# Patient Record
Sex: Female | Born: 1960 | Race: White | Hispanic: No | Marital: Married | Smoking: Never smoker
Health system: Southern US, Community
[De-identification: ages and names within clinical notes are randomized; demographics above are authoritative.]

## PROBLEM LIST (undated history)

## (undated) DIAGNOSIS — N200 Calculus of kidney: Secondary | ICD-10-CM

## (undated) DIAGNOSIS — I341 Nonrheumatic mitral (valve) prolapse: Secondary | ICD-10-CM

## (undated) DIAGNOSIS — R350 Frequency of micturition: Secondary | ICD-10-CM

## (undated) DIAGNOSIS — R011 Cardiac murmur, unspecified: Secondary | ICD-10-CM

## (undated) DIAGNOSIS — Z8619 Personal history of other infectious and parasitic diseases: Secondary | ICD-10-CM

## (undated) DIAGNOSIS — R3989 Other symptoms and signs involving the genitourinary system: Secondary | ICD-10-CM

## (undated) DIAGNOSIS — R599 Enlarged lymph nodes, unspecified: Secondary | ICD-10-CM

## (undated) DIAGNOSIS — R3915 Urgency of urination: Secondary | ICD-10-CM

## (undated) DIAGNOSIS — Z8679 Personal history of other diseases of the circulatory system: Secondary | ICD-10-CM

## (undated) HISTORY — DX: Calculus of kidney: N20.0

## (undated) HISTORY — PX: STAPEDES SURGERY: SHX789

---

## 2003-01-02 HISTORY — PX: OTHER SURGICAL HISTORY: SHX169

## 2006-03-29 ENCOUNTER — Ambulatory Visit: Payer: Self-pay

## 2006-09-08 ENCOUNTER — Ambulatory Visit: Payer: Self-pay | Admitting: Orthopaedic Surgery

## 2007-04-10 ENCOUNTER — Ambulatory Visit: Payer: Self-pay

## 2007-09-04 ENCOUNTER — Ambulatory Visit: Payer: Self-pay

## 2008-01-24 ENCOUNTER — Ambulatory Visit: Payer: Self-pay | Admitting: Unknown Physician Specialty

## 2008-01-30 ENCOUNTER — Ambulatory Visit: Payer: Self-pay | Admitting: Unknown Physician Specialty

## 2008-04-16 ENCOUNTER — Ambulatory Visit: Payer: Self-pay

## 2009-05-20 ENCOUNTER — Ambulatory Visit: Payer: Self-pay

## 2010-05-26 ENCOUNTER — Ambulatory Visit: Payer: Self-pay

## 2011-06-01 ENCOUNTER — Ambulatory Visit: Payer: Self-pay

## 2012-02-02 HISTORY — PX: BREAST BIOPSY: SHX20

## 2012-06-01 ENCOUNTER — Ambulatory Visit: Payer: Self-pay | Admitting: Family Medicine

## 2012-06-01 HISTORY — PX: COLONOSCOPY: SHX174

## 2012-06-12 ENCOUNTER — Ambulatory Visit: Payer: Self-pay | Admitting: Family Medicine

## 2012-06-22 ENCOUNTER — Ambulatory Visit: Payer: Self-pay | Admitting: Gastroenterology

## 2012-06-27 ENCOUNTER — Ambulatory Visit: Payer: Self-pay | Admitting: General Surgery

## 2012-07-19 ENCOUNTER — Ambulatory Visit (INDEPENDENT_AMBULATORY_CARE_PROVIDER_SITE_OTHER): Payer: 59 | Admitting: General Surgery

## 2012-07-19 ENCOUNTER — Encounter: Payer: Self-pay | Admitting: General Surgery

## 2012-07-19 VITALS — BP 120/80 | HR 72 | Resp 12 | Ht 64.0 in | Wt 116.0 lb

## 2012-07-19 DIAGNOSIS — R920 Mammographic microcalcification found on diagnostic imaging of breast: Secondary | ICD-10-CM | POA: Insufficient documentation

## 2012-07-19 NOTE — Patient Instructions (Addendum)
Continue self breast exams. Call office for any new breast issues or concerns. Patient wants to talk to her husband before deciding about biopsy. The stereotactic procedure was reviewed with the patient. The potential for bleeding, infection and pain was reviewed. Schedule for August 07 2012  Patient has been scheduled for a left breast stereotactic biopsy at [facility] for 08-07-12 at 3 pm. She will check-in at the [facility] at 2:30 pm. This patient is aware of date, time, and instructions. Patient verbalizes understanding.

## 2012-07-19 NOTE — Progress Notes (Signed)
Patient ID: Angela Olson, female   DOB: 1960/03/05, 52 y.o.   MRN: [id number]  Chief Complaint  Patient presents with  . Follow-up    mammogram    HPI Angela Olson is a 52 y.o. female.  Who presents for a breast evaluation referred by Dr Dear. The most recent mammogram was done on 06-12-12 CAT 4.  Patient does perform regular self breast checks and gets regular mammograms done. Family history of breast cancer includes maternal aunt. Denies breast trauma and states she "cant feel anything in the breast."  The patient was last evaluated in 2004 when she was identified with a small 9 mm simple cyst in the 3:00 position of the left breast.  HPI  Past Medical History  Diagnosis Date  . Breast cyst Dec 2004    left  . Lymph node symptom April 1998    left groin    Past Surgical History  Procedure Laterality Date  . Colonoscopy  May 2014    Dr Servando Snare  . Inner ear surgery Left 2012    Family History  Problem Relation Age of Onset  . Breast cancer Maternal Aunt     Social History History  Substance Use Topics  . Smoking status: Never Smoker   . Smokeless tobacco: Not on file  . Alcohol Use: No    Allergies  Allergen Reactions  . Tetracyclines & Related Rash    Current Outpatient Prescriptions  Medication Sig Dispense Refill  . Calcium Carbonate-Vitamin D (CALCIUM + D PO) Take 1 tablet by mouth daily.      . fish oil-omega-3 fatty acids 1000 MG capsule Take 2 g by mouth daily.       No current facility-administered medications for this visit.    Review of Systems Review of Systems  Constitutional: Negative.   Respiratory: Negative.   Cardiovascular: Negative.     Blood pressure 120/80, pulse 72, resp. rate 12, height 5\' 4"  (1.626 m), weight 116 lb (52.617 kg).  Physical Exam Physical Exam  Constitutional: She is oriented to person, place, and time. She appears well-developed and well-nourished.  Cardiovascular: Normal rate, regular rhythm and normal heart sounds.    Pulmonary/Chest: Effort normal and breath sounds normal. Right breast exhibits no inverted nipple, no mass, no nipple discharge, no skin change and no tenderness. Left breast exhibits no inverted nipple, no mass, no nipple discharge, no skin change and no tenderness.  Lymphadenopathy:    She has no cervical adenopathy.    She has no axillary adenopathy.  Neurological: She is alert and oriented to person, place, and time.  Skin: Skin is warm and dry.    Data Reviewed Focal spot compression views of the left breast in 06/12/2012 showed an indeterminate cluster microcalcifications in the upper outer quadrant for which biopsy was recommended. BI-RAD-4.  Assessment    Microcalcifications left breast.    Plan    Stereotactic breast biopsy was reviewed. The risks and benefits including those related to bleeding and infection were discussed. The patient was amenable to proceed.     Patient has been scheduled for a left breast stereotactic biopsy at [facility] for 08-07-12 at 3 pm. She will check-in at the [facility] at 2:30 pm. This patient is aware of date, time, and instructions. Patient verbalizes understanding.  Please note: Patient requested to wait till July to allow her to complete month end at work.    Angela Olson 07/20/2012, 3:53 PM

## 2012-07-20 ENCOUNTER — Encounter: Payer: Self-pay | Admitting: General Surgery

## 2012-08-03 ENCOUNTER — Telehealth: Payer: Self-pay | Admitting: *Deleted

## 2012-08-03 NOTE — Telephone Encounter (Signed)
Patient was left a message to call the office.   Per Erie Noe in mammography, patient is not longer on their stereo schedule for Monday, 08-07-12. She has not contacted our office in order to cancel but evidently contacted the scheduling department.

## 2012-08-03 NOTE — Telephone Encounter (Signed)
Patient did indeed want to cancel stereo biopsy for Monday. She wishes to wait 6 months for follow up. This patient has been placed in the recalls for November 2014 for a unilateral left breast diagnostic mammogram since last mammogram was completed in May of this year.   Kim in mammography has been notified of cancellation.

## 2012-08-16 ENCOUNTER — Ambulatory Visit: Payer: 59

## 2012-12-22 ENCOUNTER — Encounter: Payer: Self-pay | Admitting: General Surgery

## 2012-12-22 ENCOUNTER — Ambulatory Visit: Payer: Self-pay | Admitting: General Surgery

## 2013-01-03 ENCOUNTER — Ambulatory Visit (INDEPENDENT_AMBULATORY_CARE_PROVIDER_SITE_OTHER): Payer: 59 | Admitting: General Surgery

## 2013-01-03 ENCOUNTER — Encounter: Payer: Self-pay | Admitting: General Surgery

## 2013-01-03 VITALS — BP 128/78 | HR 64 | Resp 12 | Ht 64.0 in | Wt 118.0 lb

## 2013-01-03 DIAGNOSIS — R920 Mammographic microcalcification found on diagnostic imaging of breast: Secondary | ICD-10-CM

## 2013-01-03 NOTE — Progress Notes (Signed)
Patient ID: Angela Olson, female   DOB: 1960/02/17, 52 y.o.   MRN: [id number]  Chief Complaint  Patient presents with  . Follow-up    mammogram    HPI Angela Olson is a 51 y.o. female.  who presents for her 6 month follow up breast evaluation. The most recent mammogram was done on 12-22-12.  Patient does perform regular self breast checks and gets regular mammograms done.  No new breast complaints.  HPI  Past Medical History  Diagnosis Date  . Breast cyst Dec 2004    left  . Lymph node symptom April 1998    left groin    Past Surgical History  Procedure Laterality Date  . Colonoscopy  May 2014    Dr Servando Snare  . Inner ear surgery Left 2012    Family History  Problem Relation Age of Onset  . Breast cancer Maternal Aunt     Social History History  Substance Use Topics  . Smoking status: Never Smoker   . Smokeless tobacco: Never Used  . Alcohol Use: No    Allergies  Allergen Reactions  . Tetracyclines & Related Rash    Current Outpatient Prescriptions  Medication Sig Dispense Refill  . Calcium Carbonate-Vitamin D (CALCIUM + D PO) Take 1 tablet by mouth daily.      . fish oil-omega-3 fatty acids 1000 MG capsule Take 2 g by mouth daily.       No current facility-administered medications for this visit.    Review of Systems Review of Systems  Constitutional: Negative.   Respiratory: Negative.   Cardiovascular: Negative.     Blood pressure 128/78, pulse 64, resp. rate 12, height 5\' 4"  (1.626 m), weight 118 lb (53.524 kg).  Physical Exam Physical Exam  Constitutional: She is oriented to person, place, and time. She appears well-developed and well-nourished.  Neck: Neck supple.  Cardiovascular: Normal rate, regular rhythm and normal heart sounds.   Pulmonary/Chest: Effort normal and breath sounds normal. Right breast exhibits no inverted nipple, no mass, no nipple discharge, no skin change and no tenderness. Left breast exhibits no inverted nipple, no mass, no  nipple discharge, no skin change and no tenderness.  Lymphadenopathy:    She has no cervical adenopathy.    She has no axillary adenopathy.  Neurological: She is alert and oriented to person, place, and time.  Skin: Skin is warm and dry.    Data Reviewed Left breast mammogram dated December 22, 2012 suggested rounded calcifications with layering on the true lateral view consistent with milk of calcium. BI-RAD-3.  Review of her current films with the made 2014 exam shows increased number of calcifications in this area. While some additional layering, this was not a uniform finding. I really would've thought that this still would afford to the BI-RAD-4.    Assessment    Left breast microcalcifications.     Plan    Options for management were reviewed: 1) six-month followup was recommended by radiology versus 2) proceeding stereotactic biopsy to confirm a benign process. Pros and cons of each course were discussed. The patient is a little trouble but there is a discrepancy between the radiologist mild interpretation of the films. I've told her was perfectly reasonable to defer to the to the radiologist, but mild ligation was to tell her my opinion. She is at low risk, if indeed a pathologic process is present this would probably be low-grade DCIS the six-month interval will not put her at any risk.  The patient  ill consider her options and notify the office of how she would like to proceed. At a minimal range for bilateral diagnostic mammograms in 6 months to be sure she does not fall through the cracks.        [name] 01/05/2013, 8:47 PM

## 2013-01-03 NOTE — Patient Instructions (Addendum)
Continue self breast exams. Call office for any new breast issues or concerns. Patient to call back if she decides for a biopsy or for follow up mammogram Bilateral 6 months

## 2013-01-04 ENCOUNTER — Telehealth: Payer: Self-pay | Admitting: *Deleted

## 2013-01-04 NOTE — Telephone Encounter (Signed)
Pt was in the office here on 01/03/13 and was told to contact the office back when she wanted to have the BX done on her breast. She called today wanted to talk to you about the procedure she wanted to talk to you specifically. She stated that she had just a few questions to ask.

## 2013-01-07 NOTE — Telephone Encounter (Signed)
I contacted the patient regarding her desires: She would like to proceed to a left breast stereo biopsy. This will be scheduled for December 22.

## 2013-01-08 ENCOUNTER — Telehealth: Payer: Self-pay | Admitting: *Deleted

## 2013-01-08 DIAGNOSIS — R920 Mammographic microcalcification found on diagnostic imaging of breast: Secondary | ICD-10-CM

## 2013-01-08 NOTE — Telephone Encounter (Signed)
Patient has been scheduled for a left breast stereotactic biopsy at [facility] for 01-22-13 at 3 pm. She will check-in at the [facility] at 2:30 pm. This patient is aware of date, time, and instructions. Patient verbalizes understanding.

## 2013-01-08 NOTE — Telephone Encounter (Signed)
Message left on cell and work numbers for patient to call the office.  We need to arrange a left breast stereo biopsy at [facility] for 01-22-13.

## 2013-01-22 ENCOUNTER — Ambulatory Visit: Payer: Self-pay | Admitting: General Surgery

## 2013-01-22 DIAGNOSIS — R920 Mammographic microcalcification found on diagnostic imaging of breast: Secondary | ICD-10-CM

## 2013-01-23 ENCOUNTER — Telehealth: Payer: Self-pay | Admitting: General Surgery

## 2013-01-23 LAB — PATHOLOGY REPORT

## 2013-01-23 NOTE — Telephone Encounter (Signed)
Message left for her recently completed left breast biopsy was benign. She was asked to return to call to confirm receipt of the message. She will follow up with the nurse next week for mammogram and we'll arrange for bilateral diagnostic mammograms in 6 months.

## 2013-01-23 NOTE — Telephone Encounter (Signed)
Patient is in the recalls for 6 month follow up. Thanks!

## 2013-01-29 ENCOUNTER — Encounter: Payer: Self-pay | Admitting: General Surgery

## 2013-01-29 NOTE — Telephone Encounter (Signed)
Message left on patient's cell phone to confirm she received message from Dr. Lemar Livings left last week. She was also reminded of nurse follow up scheduled for 01-31-13.

## 2013-01-29 NOTE — Telephone Encounter (Signed)
Patient called the answering service today, 01-29-13 at 12:22 pm to report that she did receive message about results.

## 2013-01-30 ENCOUNTER — Ambulatory Visit: Payer: Self-pay | Admitting: General Practice

## 2013-01-31 ENCOUNTER — Ambulatory Visit (INDEPENDENT_AMBULATORY_CARE_PROVIDER_SITE_OTHER): Payer: 59 | Admitting: *Deleted

## 2013-01-31 DIAGNOSIS — R920 Mammographic microcalcification found on diagnostic imaging of breast: Secondary | ICD-10-CM

## 2013-01-31 NOTE — Progress Notes (Signed)
Patient here today for follow up post left breast stereo biopsy.  Steristrip in place and aware it may come off in one week.  Minimal bruising noted.  Aware of pathology. Follow up as scheduled.

## 2013-01-31 NOTE — Patient Instructions (Signed)
The patient is aware that a heating pad may be used for comfort as needed.   Continue self breast exams. Call office for any new breast issues or concerns. 

## 2013-06-19 ENCOUNTER — Emergency Department: Payer: Self-pay | Admitting: Emergency Medicine

## 2013-07-31 ENCOUNTER — Ambulatory Visit: Payer: 59 | Admitting: General Surgery

## 2013-08-23 ENCOUNTER — Encounter: Payer: Self-pay | Admitting: *Deleted

## 2013-09-28 ENCOUNTER — Encounter: Payer: Self-pay | Admitting: General Surgery

## 2015-02-02 DIAGNOSIS — N200 Calculus of kidney: Secondary | ICD-10-CM

## 2015-02-02 HISTORY — DX: Calculus of kidney: N20.0

## 2015-09-01 ENCOUNTER — Other Ambulatory Visit: Payer: Self-pay | Admitting: Urology

## 2015-09-01 ENCOUNTER — Encounter (HOSPITAL_BASED_OUTPATIENT_CLINIC_OR_DEPARTMENT_OTHER): Payer: Self-pay | Admitting: *Deleted

## 2015-09-01 NOTE — Progress Notes (Signed)
NPO AFTER MN.  ARRIVE AT [id number].  NEEDS HG.  MAY TAKE ZOFRAN/ VICODIN IF NEEDED AM DOS W/ SIPS OF WATER .

## 2015-09-03 ENCOUNTER — Ambulatory Visit (HOSPITAL_BASED_OUTPATIENT_CLINIC_OR_DEPARTMENT_OTHER)
Admission: RE | Admit: 2015-09-03 | Discharge: 2015-09-03 | Disposition: A | Discharge status: Home / Self Care | Payer: 59 | Source: Ambulatory Visit | Attending: Urology | Admitting: Urology

## 2015-09-03 ENCOUNTER — Ambulatory Visit (HOSPITAL_BASED_OUTPATIENT_CLINIC_OR_DEPARTMENT_OTHER): Payer: 59 | Admitting: Anesthesiology

## 2015-09-03 ENCOUNTER — Encounter (HOSPITAL_BASED_OUTPATIENT_CLINIC_OR_DEPARTMENT_OTHER): Payer: Self-pay | Admitting: *Deleted

## 2015-09-03 ENCOUNTER — Encounter (HOSPITAL_BASED_OUTPATIENT_CLINIC_OR_DEPARTMENT_OTHER)
Admission: RE | Disposition: A | Discharge status: Home / Self Care | Payer: Self-pay | Source: Ambulatory Visit | Attending: Urology

## 2015-09-03 DIAGNOSIS — N200 Calculus of kidney: Secondary | ICD-10-CM | POA: Diagnosis present

## 2015-09-03 DIAGNOSIS — N201 Calculus of ureter: Secondary | ICD-10-CM

## 2015-09-03 DIAGNOSIS — N132 Hydronephrosis with renal and ureteral calculous obstruction: Secondary | ICD-10-CM | POA: Insufficient documentation

## 2015-09-03 HISTORY — DX: Frequency of micturition: R35.0

## 2015-09-03 HISTORY — PX: STONE EXTRACTION WITH BASKET: SHX5318

## 2015-09-03 HISTORY — DX: Urgency of urination: R39.15

## 2015-09-03 HISTORY — PX: CYSTOSCOPY W/ RETROGRADES: SHX1426

## 2015-09-03 HISTORY — DX: Enlarged lymph nodes, unspecified: R59.9

## 2015-09-03 HISTORY — DX: Personal history of other diseases of the circulatory system: Z86.79

## 2015-09-03 HISTORY — DX: Other symptoms and signs involving the genitourinary system: R39.89

## 2015-09-03 HISTORY — DX: Calculus of kidney: N20.0

## 2015-09-03 HISTORY — DX: Nonrheumatic mitral (valve) prolapse: I34.1

## 2015-09-03 HISTORY — DX: Cardiac murmur, unspecified: R01.1

## 2015-09-03 HISTORY — DX: Personal history of other infectious and parasitic diseases: Z86.19

## 2015-09-03 HISTORY — PX: CYSTOSCOPY WITH RETROGRADE PYELOGRAM, URETEROSCOPY AND STENT PLACEMENT: SHX5789

## 2015-09-03 HISTORY — PX: CYSTOSCOPY WITH HOLMIUM LASER LITHOTRIPSY: SHX6639

## 2015-09-03 LAB — POCT HEMOGLOBIN-HEMACUE: HEMOGLOBIN: 13.1 g/dL (ref 12.0–15.0)

## 2015-09-03 SURGERY — CYSTOURETEROSCOPY, WITH RETROGRADE PYELOGRAM AND STENT INSERTION
Anesthesia: General | Site: Ureter | Laterality: Right

## 2015-09-03 MED ORDER — LIDOCAINE HCL (CARDIAC) 20 MG/ML IV SOLN
INTRAVENOUS | Status: DC | PRN
  Administered 2015-09-03: 60 mg via INTRAVENOUS

## 2015-09-03 MED ORDER — PROPOFOL 500 MG/50ML IV EMUL
INTRAVENOUS | Status: DC | PRN
  Administered 2015-09-03: 160 mL via INTRAVENOUS

## 2015-09-03 MED ORDER — HYDROCODONE-ACETAMINOPHEN 5-325 MG PO TABS
ORAL_TABLET | ORAL | Status: AC
  Filled 2015-09-03: qty 1

## 2015-09-03 MED ORDER — CEFAZOLIN SODIUM-DEXTROSE 2-4 GM/100ML-% IV SOLN
INTRAVENOUS | Status: AC
  Filled 2015-09-03: qty 100

## 2015-09-03 MED ORDER — LACTATED RINGERS IV SOLN
INTRAVENOUS | Status: DC
  Administered 2015-09-03: 09:00:00 via INTRAVENOUS
  Filled 2015-09-03: qty 1000

## 2015-09-03 MED ORDER — IOHEXOL 300 MG/ML  SOLN
INTRAMUSCULAR | Status: DC | PRN
  Administered 2015-09-03: 20 mL via URETHRAL
  Administered 2015-09-03: 7 mL via URETHRAL

## 2015-09-03 MED ORDER — HYDROCODONE-ACETAMINOPHEN 5-325 MG PO TABS
1.0000 | ORAL_TABLET | ORAL | Status: DC | PRN
  Administered 2015-09-03: 1 via ORAL
  Filled 2015-09-03: qty 1

## 2015-09-03 MED ORDER — ONDANSETRON HCL 4 MG/2ML IJ SOLN
INTRAMUSCULAR | Status: AC
  Filled 2015-09-03: qty 2

## 2015-09-03 MED ORDER — URELLE 81 MG PO TABS
1.0000 | ORAL_TABLET | Freq: Four times a day (QID) | ORAL | Status: DC
Start: 2015-09-03 — End: 2015-09-03
  Administered 2015-09-03: 81 mg via ORAL
  Filled 2015-09-03: qty 1

## 2015-09-03 MED ORDER — SODIUM CHLORIDE 0.9 % IR SOLN
Status: DC | PRN
  Administered 2015-09-03: 3000 mL via INTRAVESICAL
  Administered 2015-09-03 (×2): 1000 mL via INTRAVESICAL

## 2015-09-03 MED ORDER — LIDOCAINE 2% (20 MG/ML) 5 ML SYRINGE
INTRAMUSCULAR | Status: DC | PRN
  Administered 2015-09-03: 60 mg via INTRAVENOUS

## 2015-09-03 MED ORDER — LIDOCAINE HCL 2 % EX GEL
CUTANEOUS | Status: AC
  Filled 2015-09-03: qty 5

## 2015-09-03 MED ORDER — FENTANYL CITRATE (PF) 100 MCG/2ML IJ SOLN
INTRAMUSCULAR | Status: AC
  Filled 2015-09-03: qty 4

## 2015-09-03 MED ORDER — LIDOCAINE HCL 2 % EX GEL
CUTANEOUS | Status: DC | PRN
  Administered 2015-09-03: 1 via URETHRAL

## 2015-09-03 MED ORDER — MIDAZOLAM HCL 2 MG/2ML IJ SOLN
INTRAMUSCULAR | Status: AC
  Filled 2015-09-03: qty 2

## 2015-09-03 MED ORDER — DEXAMETHASONE SODIUM PHOSPHATE 10 MG/ML IJ SOLN
INTRAMUSCULAR | Status: AC
  Filled 2015-09-03: qty 1

## 2015-09-03 MED ORDER — PROPOFOL 10 MG/ML IV BOLUS
INTRAVENOUS | Status: AC
  Filled 2015-09-03: qty 60

## 2015-09-03 MED ORDER — ONDANSETRON HCL 4 MG/2ML IJ SOLN
INTRAMUSCULAR | Status: DC | PRN
  Administered 2015-09-03: 4 mg via INTRAVENOUS

## 2015-09-03 MED ORDER — DEXAMETHASONE SODIUM PHOSPHATE 4 MG/ML IJ SOLN
INTRAMUSCULAR | Status: DC | PRN
  Administered 2015-09-03: 10 mg via INTRAVENOUS

## 2015-09-03 MED ORDER — ONDANSETRON HCL 4 MG/2ML IJ SOLN
4.0000 mg | Freq: Once | INTRAMUSCULAR | Status: AC
  Administered 2015-09-03: 4 mg via INTRAVENOUS
  Filled 2015-09-03: qty 2

## 2015-09-03 MED ORDER — URIBEL 118 MG PO CAPS: 1.0000 | ORAL_CAPSULE | Freq: Three times a day (TID) | ORAL | 1 refills | Status: DC | PRN

## 2015-09-03 MED ORDER — LIDOCAINE HCL (CARDIAC) 20 MG/ML IV SOLN
INTRAVENOUS | Status: AC
  Filled 2015-09-03: qty 5

## 2015-09-03 MED ORDER — DEXAMETHASONE SODIUM PHOSPHATE 10 MG/ML IJ SOLN
INTRAMUSCULAR | Status: DC | PRN
  Administered 2015-09-03: 10 mg via INTRAVENOUS

## 2015-09-03 MED ORDER — BELLADONNA ALKALOIDS-OPIUM 16.2-60 MG RE SUPP
RECTAL | Status: AC
  Filled 2015-09-03: qty 1

## 2015-09-03 MED ORDER — FENTANYL CITRATE (PF) 100 MCG/2ML IJ SOLN
INTRAMUSCULAR | Status: DC | PRN
  Administered 2015-09-03: 50 ug via INTRAVENOUS

## 2015-09-03 MED ORDER — CEFAZOLIN SODIUM-DEXTROSE 2-4 GM/100ML-% IV SOLN
2.0000 g | INTRAVENOUS | Status: AC
  Administered 2015-09-03: 2 g via INTRAVENOUS
  Filled 2015-09-03: qty 100

## 2015-09-03 MED ORDER — MIDAZOLAM HCL 5 MG/5ML IJ SOLN
INTRAMUSCULAR | Status: DC | PRN
  Administered 2015-09-03: 2 mg via INTRAVENOUS

## 2015-09-03 MED ORDER — URELLE 81 MG PO TABS
ORAL_TABLET | ORAL | Status: AC
  Filled 2015-09-03: qty 1

## 2015-09-03 SURGICAL SUPPLY — 37 items
ADAPTER CATH URET PLST 4-6FR (CATHETERS) IMPLANT
BAG DRAIN URO-CYSTO SKYTR STRL (DRAIN) ×5 IMPLANT
BASKET LASER NITINOL 1.9FR (BASKET) IMPLANT
BASKET STNLS GEMINI 4WIRE 3FR (BASKET) IMPLANT
BASKET ZERO TIP NITINOL 2.4FR (BASKET) ×5 IMPLANT
BENZOIN TINCTURE PRP APPL 2/3 (GAUZE/BANDAGES/DRESSINGS) ×5 IMPLANT
CATH INTERMIT  6FR 70CM (CATHETERS) ×5 IMPLANT
CATH URET 5FR 28IN CONE TIP (BALLOONS)
CATH URET 5FR 28IN OPEN ENDED (CATHETERS) IMPLANT
CATH URET 5FR 70CM CONE TIP (BALLOONS) IMPLANT
CLOTH BEACON ORANGE TIMEOUT ST (SAFETY) ×5 IMPLANT
DRSG TEGADERM 2-3/8X2-3/4 SM (GAUZE/BANDAGES/DRESSINGS) ×5 IMPLANT
FIBER LASER FLEXIVA 1000 (UROLOGICAL SUPPLIES) IMPLANT
FIBER LASER FLEXIVA 365 (UROLOGICAL SUPPLIES) IMPLANT
FIBER LASER FLEXIVA 550 (UROLOGICAL SUPPLIES) IMPLANT
FIBER LASER TRAC TIP (UROLOGICAL SUPPLIES) ×5 IMPLANT
GLOVE BIO SURGEON STRL SZ7.5 (GLOVE) ×5 IMPLANT
GOWN STRL REUS W/ TWL XL LVL3 (GOWN DISPOSABLE) ×3 IMPLANT
GOWN STRL REUS W/TWL XL LVL3 (GOWN DISPOSABLE) ×2
GUIDEWIRE 0.038 PTFE COATED (WIRE) IMPLANT
GUIDEWIRE ANG ZIPWIRE 038X150 (WIRE) IMPLANT
GUIDEWIRE STR DUAL SENSOR (WIRE) ×5 IMPLANT
IV NS 1000ML (IV SOLUTION) ×4
IV NS 1000ML BAXH (IV SOLUTION) ×6 IMPLANT
IV NS IRRIG 3000ML ARTHROMATIC (IV SOLUTION) ×5 IMPLANT
KIT BALLIN UROMAX 15FX10 (LABEL) IMPLANT
KIT BALLN UROMAX 15FX4 (MISCELLANEOUS) IMPLANT
KIT BALLN UROMAX 26 75X4 (MISCELLANEOUS)
KIT ROOM TURNOVER WOR (KITS) ×5 IMPLANT
MANIFOLD NEPTUNE II (INSTRUMENTS) ×5 IMPLANT
NS IRRIG 500ML POUR BTL (IV SOLUTION) ×5 IMPLANT
PACK CYSTO (CUSTOM PROCEDURE TRAY) ×5 IMPLANT
SET HIGH PRES BAL DIL (LABEL)
SHEATH ACCESS URETERAL 38CM (SHEATH) IMPLANT
STENT URET 6FRX24 CONTOUR (STENTS) ×5 IMPLANT
TUBE CONNECTING 12'X1/4 (SUCTIONS) ×1
TUBE CONNECTING 12X1/4 (SUCTIONS) ×4 IMPLANT

## 2015-09-03 NOTE — Interval H&P Note (Signed)
History and Physical Interval Note:  09/03/2015 9:47 AM  Angela Olson  has presented today for surgery, with the diagnosis of RIGHT URETERAL AND RIGHT RENAL CALCULUS  The various methods of treatment have been discussed with the patient and family. After consideration of risks, benefits and other options for treatment, the patient has consented to  Procedure(s) with comments: CYSTOSCOPY WITH RETROGRADE PYELOGRAM, URETEROSCOPY AND STENT PLACEMENT (Right) - 1 HOUR CYSTOSCOPY WITH HOLMIUM LASER LITHOTRIPSY (Right) as a surgical intervention .  The patient's history has been reviewed, patient examined, no change in status, stable for surgery.  I have reviewed the patient's chart and labs.  Questions were answered to the patient's satisfaction.     Roscoe Witts S

## 2015-09-03 NOTE — Anesthesia Preprocedure Evaluation (Addendum)
Anesthesia Evaluation  Patient identified by MRN, date of birth, ID band Patient awake    Reviewed: Allergy & Precautions, NPO status , Patient's Chart, lab work & pertinent test results  Airway Mallampati: II  TM Distance: >3 FB Neck ROM: Full    Dental no notable dental hx.    Pulmonary neg pulmonary ROS,    Pulmonary exam normal breath sounds clear to auscultation       Cardiovascular Normal cardiovascular exam+ Valvular Problems/Murmurs MVP  Rhythm:Regular Rate:Normal     Neuro/Psych negative neurological ROS  negative psych ROS   GI/Hepatic negative GI ROS, Neg liver ROS,   Endo/Other  negative endocrine ROS  Renal/GU Renal diseasenegative Renal ROS  negative genitourinary   Musculoskeletal negative musculoskeletal ROS (+)   Abdominal   Peds negative pediatric ROS (+)  Hematology negative hematology ROS (+)   Anesthesia Other Findings   Reproductive/Obstetrics negative OB ROS                            Anesthesia Physical Anesthesia Plan  ASA: II  Anesthesia Plan: General   Post-op Pain Management:    Induction: Intravenous  Airway Management Planned:   Additional Equipment:   Intra-op Plan:   Post-operative Plan: Extubation in OR  Informed Consent: I have reviewed the patients History and Physical, chart, labs and discussed the procedure including the risks, benefits and alternatives for the proposed anesthesia with the patient or authorized representative who has indicated his/her understanding and acceptance.   Dental advisory given  Plan Discussed with: CRNA  Anesthesia Plan Comments:         Anesthesia Quick Evaluation

## 2015-09-03 NOTE — Anesthesia Procedure Notes (Signed)
Procedure Name: LMA Insertion Date/Time: 09/03/2015 9:48 AM Performed by: Renella Cunas D Pre-anesthesia Checklist: Patient identified, Emergency Drugs available, Suction available and Patient being monitored Patient Re-evaluated:Patient Re-evaluated prior to inductionOxygen Delivery Method: Circle system utilized Preoxygenation: Pre-oxygenation with 100% oxygen Intubation Type: IV induction Ventilation: Mask ventilation without difficulty LMA: LMA inserted LMA Size: 3.0 Number of attempts: 1 Airway Equipment and Method: Bite block Placement Confirmation: positive ETCO2 Tube secured with: Tape Dental Injury: Teeth and Oropharynx as per pre-operative assessment

## 2015-09-03 NOTE — Interval H&P Note (Signed)
History and Physical Interval Note:  09/03/2015 9:47 AM  Angela Olson  has presented today for surgery, with the diagnosis of RIGHT URETERAL AND RIGHT RENAL CALCULUS  The various methods of treatment have been discussed with the patient and family. After consideration of risks, benefits and other options for treatment, the patient has consented to  Procedure(s) with comments: CYSTOSCOPY WITH RETROGRADE PYELOGRAM, URETEROSCOPY AND STENT PLACEMENT (Right) - 1 HOUR CYSTOSCOPY WITH HOLMIUM LASER LITHOTRIPSY (Right) as a surgical intervention .  The patient's history has been reviewed, patient examined, no change in status, stable for surgery.  I have reviewed the patient's chart and labs.  Questions were answered to the patient's satisfaction.     [name]   

## 2015-09-03 NOTE — Discharge Instructions (Addendum)
[facility] [phone] Post Ureteroscopy With or Without Stent Instructions  Definitions:  Ureter: The duct that transports urine from the kidney to the bladder. Stent:   A plastic hollow tube that is placed into the ureter, from the kidney to the  bladder to prevent the ureter from swelling shut.  GENERAL INSTRUCTIONS:  Despite the fact that no skin incisions were used, the area around the ureter and bladder is raw and irritated. The stent is a foreign body which will further irritate the bladder wall. This irritation is manifested by increased frequency of urination, both day and night, and by an increase in the urge to urinate. In some, the urge to urinate is present almost always. Sometimes the urge is strong enough that you may not be able to stop yourself from urinating. The only real cure is to remove the stent and then give time for the bladder wall to heal which can't be done until the danger of the ureter swelling shut has passed, which varies.  You may see some blood in your urine while the stent is in place and a few days afterwards. Do not be alarmed, even if the urine was clear for a while. Get off your feet and drink lots of fluids until clearing occurs. If you start to pass clots or don't improve, call us.  DIET: You may return to your normal diet immediately. Because of the raw surface of your bladder, alcohol, spicy foods, acid type foods and drinks with caffeine may cause irritation or frequency and should be used in moderation. To keep your urine flowing freely and to avoid constipation, drink plenty of fluids during the day ( 8-10 glasses ). Tip: Avoid cranberry juice because it is very acidic.  ACTIVITY: Your physical activity doesn't need to be restricted. However, if you are very active, you may see some blood in your urine. We suggest that you reduce your activity under these circumstances until the bleeding has stopped.  BOWELS: It is important to  keep your bowels regular during the postoperative period. Straining with bowel movements can cause bleeding. A bowel movement every other day is reasonable. Use a mild laxative if needed, such as Milk of Magnesia 2-3 tablespoons, or 2 Dulcolax tablets. Call if you continue to have problems. If you have been taking narcotics for pain, before, during or after your surgery, you may be constipated. Take a laxative if necessary.   MEDICATION: You should resume your pre-surgery medications unless told not to. In addition you will often be given an antibiotic to prevent infection. These should be taken as prescribed until the bottles are finished unless you are having an unusual reaction to one of the drugs.  PROBLEMS YOU SHOULD REPORT TO [place]:  Fevers over 100.5 Fahrenheit.  Heavy bleeding, or clots ( See above notes about blood in urine ).  Inability to urinate.  Drug reactions ( hives, rash, nausea, vomiting, diarrhea ).  Severe burning or pain with urination that is not improving.  FOLLOW-UP: You will need a follow-up appointment to monitor your progress. Call for this appointment at the number listed above. Usually the first appointment will be about three to fourteen days after your surgery.  You can remove your stent on Saturday morning by pulling on the string. If you are not comfortable doing that yourself we will be happy to have one of the nurses in our office do that on Monday morning. We will call you to establish a follow-up appointment.  Post  Anesthesia Home Care Instructions  Activity: Get plenty of rest for the remainder of the day. A responsible adult should stay with you for 24 hours following the procedure.  For the next 24 hours, DO NOT: -Drive a car -Advertising copywriter -Drink alcoholic beverages -Take any medication unless instructed by your physician -Make any legal decisions or sign important papers.  Meals: Start with liquid foods such as gelatin or soup. Progress to  regular foods as tolerated. Avoid greasy, spicy, heavy foods. If nausea and/or vomiting occur, drink only clear liquids until the nausea and/or vomiting subsides. Call your physician if vomiting continues.  Special Instructions/Symptoms: Your throat may feel dry or sore from the anesthesia or the breathing tube placed in your throat during surgery. If this causes discomfort, gargle with warm salt water. The discomfort should disappear within 24 hours.  If you had a scopolamine patch placed behind your ear for the management of post- operative nausea and/or vomiting:  1. The medication in the patch is effective for 72 hours, after which it should be removed.  Wrap patch in a tissue and discard in the trash. Wash hands thoroughly with soap and water. 2. You may remove the patch earlier than 72 hours if you experience unpleasant side effects which may include dry mouth, dizziness or visual disturbances. 3. Avoid touching the patch. Wash your hands with soap and water after contact with the patch.

## 2015-09-03 NOTE — Transfer of Care (Signed)
Immediate Anesthesia Transfer of Care Note  Patient: Angela Olson  Procedure(s) Performed: Procedure(s) with comments: CYSTOSCOPY WITH RETROGRADE PYELOGRAM, URETEROSCOPY AND STENT PLACEMENT (Right) - 1 HOUR CYSTOSCOPY WITH HOLMIUM LASER LITHOTRIPSY (Right) CYSTOSCOPY WITH RETROGRADE PYELOGRAM (Bilateral) STONE EXTRACTION WITH BASKET (Right)  Patient Location: PACU  Anesthesia Type:General  Level of Consciousness: awake, alert , oriented and patient cooperative  Airway & Oxygen Therapy: Patient Spontanous Breathing and Patient connected to nasal cannula oxygen  Post-op Assessment: Report given to RN and Post -op Vital signs reviewed and stable  Post vital signs: Reviewed and stable  Last Vitals:  Vitals:   09/03/15 0808  BP: 116/62  Pulse: 78  Resp: 14  Temp: 37.1 C    Last Pain:  Vitals:   09/03/15 0836  TempSrc:   PainSc: 4       Patients Stated Pain Goal: 7 (09/03/15 0836)  Complications: No apparent anesthesia complications

## 2015-09-03 NOTE — H&P (Signed)
Office Visit Report     08/25/2015   --------------------------------------------------------------------------------   Angela Olson  MRN: [id number]  PRIMARY CARE:    DOB: 1960/10/30, 55 year old Female  REFERRING:    SSN: [id number]  PROVIDER:  Barron Alvine, M.D.    LOCATION:  [facility] [phone]   --------------------------------------------------------------------------------   CC: I have kidney stones.  HPI: Angela Olson is a 55 year-old female patient who is here for renal calculi.  The problem is on the right side. . This is her first kidney stone. She is currently having flank pain and back pain. She denies having groin pain, nausea, vomiting, fever, and chills. She has not caught a stone in her urine strainer since her symptoms began.   She has never had surgical treatment for calculi in the past.   Angela Olson has had some vague symptoms x 2-3 weeks  She has had some intermittent nausea and some emesis yesterday. She has also had some episodes of gross hematuria. She presented to the emergency room at [facility] yesterday where she was diagnosed with a 6 mm right UVJ stone. It was mild right hydronephrosis. We do not have the official report to his diagnosis and no imaging study at this time. Her symptoms are currently relatively mild. She has not obviously passed the stone.     ALLERGIES: Amoxicillin Hydrocodone Tetracycline Zithromax    MEDICATIONS: Allergy  Calcium Citrate - Vitamin D3 1 PO Daily  Fish Oil     GU PSH: None   NON-GU PSH: Anesth, Ear Surgery - 2007    GU PMH: None   NON-GU PMH: Cardiac murmur, unspecified    FAMILY HISTORY: Kidney Stones - Father Thyroid Disorder - Mother   SOCIAL HISTORY: Marital Status: Married Current Smoking Status: Patient has never smoked.  Has never drank.  Does not drink caffeine.    REVIEW OF SYSTEMS:    GU Review Female:   Patient denies frequent urination, hard to postpone urination, burning  /pain with urination, get up at night to urinate, leakage of urine, stream starts and stops, trouble starting your stream, have to strain to urinate, and currently pregnant.  Gastrointestinal (Upper):   Patient reports nausea and vomiting. Patient denies indigestion/ heartburn.  Gastrointestinal (Lower):   Patient reports diarrhea. Patient denies constipation.  Constitutional:   Patient denies fever, night sweats, weight loss, and fatigue.  Skin:   Patient denies skin rash/ lesion and itching.  Eyes:   Patient denies blurred vision and double vision.  Ears/ Nose/ Throat:   Patient denies sore throat and sinus problems.  Hematologic/Lymphatic:   Patient denies swollen glands and easy bruising.  Cardiovascular:   Patient denies leg swelling and chest pains.  Respiratory:   Patient denies cough and shortness of breath.  Endocrine:   Patient denies excessive thirst.  Musculoskeletal:   Patient reports back pain. Patient denies joint pain.  Neurological:   Patient denies headaches and dizziness.  Psychologic:   Patient denies depression and anxiety.   VITAL SIGNS:      08/25/2015 01:10 PM  Weight 120 lb / 54.43 kg  Height 64 in / 162.56 cm  Pulse 65 /min  Temperature 97.8 F / 37 C  BMI 20.6 kg/m   MULTI-SYSTEM PHYSICAL EXAMINATION:    Constitutional: Well-nourished. No physical deformities. Normally developed. Good grooming.  Neck: Neck symmetrical, not swollen. Normal tracheal position.  Respiratory: No labored breathing, no use of accessory muscles.   Cardiovascular: Normal temperature, normal extremity  pulses, no swelling, no varicosities.  Lymphatic: No enlargement of neck, axillae, groin.  Skin: No paleness, no jaundice, no cyanosis. No lesion, no ulcer, no rash.  Neurologic / Psychiatric: Oriented to time, oriented to place, oriented to person. No depression, no anxiety, no agitation.  Gastrointestinal: No mass, no tenderness, no rigidity, non obese abdomen.  Eyes: Normal  conjunctivae. Normal eyelids.  Ears, Nose, Mouth, and Throat: Left ear no scars, no lesions, no masses. Right ear no scars, no lesions, no masses. Nose no scars, no lesions, no masses. Normal hearing. Normal lips.  Musculoskeletal: Normal gait and station of head and neck.     PAST DATA REVIEWED:  Source Of History:  Patient   PROCEDURES:         KUB - 74000  A single view of the abdomen is obtained.  Calculi:  bilateral renal calculit noted no obviuos distal ureteral stone seen               Urinalysis w/Scope - 81001 Dipstick Dipstick Cont'd Micro  Specimen: Voided Bilirubin: Neg WBC/hpf: 0-5/hpf  Color: Orange Ketones: Neg RBC/hpf: 0-2/hpf  Appearance: Clear Blood: 2+ Bacteria: Rare  Specific Gravity: 1.010 Protein: Neg Cystals: NS (Not Seen)  pH: 6.0 Urobilinogen: 0.2 Casts: NS (Not Seen)  Glucose: Neg Nitrites: Positive Trichomonas: Not Present    Leukocyte Esterase: Trace Mucous: Not Present      Epithelial Cells: 0-5/hpf      Yeast: NS (Not Seen)      Sperm: Not Present         Ketoralac 60mg  - [id number], [id number] Qty: 60 Adm. By: Gearldine Bienenstock May  Unit: mg Lot No [id number]  Route: IM Exp. Date 11/10/2016  Freq: None Mfgr.:   Site: Right Hip   ASSESSMENT:      ICD-10 Details  1 GU:   Calculus Ureter - N20.1    PLAN:            Medications New Meds: Flomax 0.4 mg capsule, ext release 24 hr 1 capsule PO Daily   #30  0 Refill(s)  Vicodin 5 mg-300 mg tablet 1-2 tablet PO Q 6 H   #20  0 Refill(s)            Orders X-Rays: KUB          Schedule X-Rays: 1 Week - KUB  Return Visit: 1 Week - Extender          Document Letter(s):  Created for Patient: Clinical Summary         Notes:   [name] developed typical right-sided renal colic and was [facility] with a 6 mm distal right ureteral stone. She has not obviously passed the stone and does continue to have some mild discomfort and nausea. On KUB imaging today she does have some bilateral renal calculi that  she was not informed on. I could not clearly see a distal stone but there was some colonic material in the right hemipelvis and also it is possible that the stone was overlying the bony structures. Her urinalysis does show some ongoing microhematuria. If indeed she has a 6 mm stone she understands the chance of spontaneous passage is probably in the 50-60% range. There is no absolute indication for intervention at this time. I would encourage her to see about trying to pass this spontaneously and hopefully we can get her imaging studies sent to [place].. Before embarking on surgery she may need a repeat stone protocol CT. We cannot confirm presence of  the stone. We will get her a follow-up for 1 week and sooner if needed. I will renew pain medication and tamsulosin for her.    * Signed by Barron Alvine, M.D. on 08/25/15 at 2:39 PM (EDT*     The information contained in this medical record document is considered private and confidential patient information. This information can only be used for the medical diagnosis and/or medical services that are being provided by the patient's selected caregivers. This information can only be distributed outside of the patient's care if the patient agrees and signs waivers of authorization for this information to be sent to an outside source or route.

## 2015-09-03 NOTE — Anesthesia Preprocedure Evaluation (Signed)
Anesthesia Evaluation    Airway        Dental   Pulmonary           Cardiovascular + Valvular Problems/Murmurs MVP      Neuro/Psych    GI/Hepatic   Endo/Other    Renal/GU      Musculoskeletal   Abdominal   Peds  Hematology   Anesthesia Other Findings   Reproductive/Obstetrics                             Anesthesia Physical Anesthesia Plan Anesthesia Quick Evaluation

## 2015-09-03 NOTE — Op Note (Signed)
Preoperative diagnosis: Right distal ureteral stone, bilateral renal calculi Postoperative diagnosis: Right distal ureteral stone  Procedure: Cystoscopy, right retrograde pyelography, ureteroscopy, holmium laser lithotripsy, basketing of stone fragments, right flexible ureteroscopy of right collecting system, right double-J stent placement, left retrograde pyelogram   Surgeon: Valetta Fuller M.D.  Anesthesia: Gen.  Indications: Angela Olson presented with typical right renal colic while [place]ing at the beach. CT at that time revealed a distal 6 mm right ureteral stone and bilateral renal calculi of 8-9 mm. She presented to our office for reassessment and continued to have intermittent hematuria as well as abdominal groin and flank discomfort. She requested definitive intervention. We felt ureteroscopy would be most appropriate. She has for consideration of treatment of her renal calculi. We did offer an attempt at treating the right renal stone if we were successful in treating the distal ureteral calculus. Risks and benefits of these procedures were discussed with her in detail.     Technique and findings: Patient was brought the operating room where she has successful induction of general anesthesia. She was placed in lithotomy position and prepped and draped in usual manner. Cystoscopy revealed unremarkable urethra. The entire right hemitrigone was markedly inflamed. A stone could be seen crowning from the right ureteral orifice. The left ureteral orifice was normal. We were unable to identify an obvious lumen given the crowning stone. Through the cystoscope I used a 200  holmium laser fiber to fracture the stone at 0.5 J 5 Hz. I was then able to find an opening to slide a guidewire to the right renal pelvis. A small ureterotomy was made with the laser fiber. We were able to engage the distal ureter with a rigid ureteroscope. Additional stone was then fragmented with the holmium laser fiber. Stone  fragments were then removed. No obvious complications or problems occurred.   Attention was then turned towards the right renal calculus. He could be seen quite easily on fluoroscopy and appeared to be about evident 8 mm in size. A access sheath was placed without difficulty. The digital flexible ureteroscope was then inserted. We were able to examine all the calyces and utilizing fluoroscopy appeared to be right on top of the stone yet no stone was visualized.   For that reason retrograde pyelography was performed through the ureteroscope. The entire calyceal system opacified. The calcification appeared to be outside of that opacified collecting system. I felt this calcification was either intraparenchymal or potentially with an 8 small calyx that had an incredibly tight infundibulum that could not be identified.   We removed the access sheath after reinserting the wire. A 24 cm 6 French double-J stent with a dangle string was then placed with fluoroscopic as well as visual guidance.   We performed left-sided retrograde pyelography. There was no evidence of obvious filling defect or obstruction. I couldn't see a suspicious calcification but it was difficult to determine whether was actually in the collecting system or not. The patient was brought to PACU in stable condition having had no obvious complications or problems.

## 2015-09-04 ENCOUNTER — Encounter (HOSPITAL_BASED_OUTPATIENT_CLINIC_OR_DEPARTMENT_OTHER): Payer: Self-pay | Admitting: Urology

## 2015-09-05 NOTE — Anesthesia Postprocedure Evaluation (Signed)
Anesthesia Post Note  Patient: Angela Olson  Procedure(s) Performed: Procedure(s) (LRB): CYSTOSCOPY WITH RETROGRADE PYELOGRAM, URETEROSCOPY AND STENT PLACEMENT (Right) CYSTOSCOPY WITH HOLMIUM LASER LITHOTRIPSY (Right) CYSTOSCOPY WITH RETROGRADE PYELOGRAM (Bilateral) STONE EXTRACTION WITH BASKET (Right)  Patient location during evaluation: PACU Anesthesia Type: General Level of consciousness: awake and alert Pain management: pain level controlled Vital Signs Assessment: post-procedure vital signs reviewed and stable Respiratory status: spontaneous breathing, nonlabored ventilation, respiratory function stable and patient connected to nasal cannula oxygen Cardiovascular status: blood pressure returned to baseline and stable Postop Assessment: no signs of nausea or vomiting Anesthetic complications: no    Last Vitals:  Vitals:   09/03/15 1130 09/03/15 1336  BP: 121/77 116/62  Pulse: 63 63  Resp: 17 16  Temp:  36.7 C    Last Pain:  Vitals:   09/03/15 1345  TempSrc:   PainSc: 4                  Phillips Grout

## 2015-09-22 ENCOUNTER — Other Ambulatory Visit: Payer: Self-pay | Admitting: *Deleted

## 2015-09-22 ENCOUNTER — Inpatient Hospital Stay
Admission: RE | Admit: 2015-09-22 | Discharge: 2015-09-22 | Disposition: A | Discharge status: Home / Self Care | Payer: Self-pay | Source: Ambulatory Visit | Attending: *Deleted | Admitting: *Deleted

## 2015-09-22 DIAGNOSIS — Z9289 Personal history of other medical treatment: Secondary | ICD-10-CM

## 2015-11-05 ENCOUNTER — Other Ambulatory Visit: Payer: Self-pay | Admitting: Urology

## 2015-11-12 ENCOUNTER — Other Ambulatory Visit: Payer: Self-pay | Admitting: Urology

## 2015-11-24 ENCOUNTER — Other Ambulatory Visit: Payer: Self-pay | Admitting: Obstetrics and Gynecology

## 2015-11-24 DIAGNOSIS — Z1231 Encounter for screening mammogram for malignant neoplasm of breast: Secondary | ICD-10-CM

## 2015-12-11 ENCOUNTER — Encounter (HOSPITAL_BASED_OUTPATIENT_CLINIC_OR_DEPARTMENT_OTHER): Payer: Self-pay | Admitting: *Deleted

## 2015-12-11 NOTE — Progress Notes (Signed)
NPO AFTER MN.  ARRIVE AT 0700.  NEEDS HG.  WILL TAKE AM MED W/ SIPS OF WATER DOS.

## 2015-12-16 NOTE — Anesthesia Preprocedure Evaluation (Addendum)
Anesthesia Evaluation  Patient identified by MRN, date of birth, ID band Patient awake    Reviewed: Allergy & Precautions, H&P , NPO status , Patient's Chart, lab work & pertinent test results  Airway Mallampati: I  TM Distance: >3 FB Neck ROM: Full    Dental no notable dental hx. (+) Teeth Intact, Dental Advisory Given,    Pulmonary neg pulmonary ROS,    Pulmonary exam normal breath sounds clear to auscultation       Cardiovascular Exercise Tolerance: Good + Valvular Problems/Murmurs MVP  Rhythm:Regular Rate:Normal     Neuro/Psych negative neurological ROS  negative psych ROS   GI/Hepatic negative GI ROS, Neg liver ROS,   Endo/Other  negative endocrine ROS  Renal/GU Renal disease  negative genitourinary   Musculoskeletal   Abdominal   Peds  Hematology negative hematology ROS (+)   Anesthesia Other Findings   Reproductive/Obstetrics negative OB ROS                           Anesthesia Physical Anesthesia Plan  ASA: II  Anesthesia Plan: General   Post-op Pain Management:    Induction: Intravenous  Airway Management Planned: LMA  Additional Equipment:   Intra-op Plan:   Post-operative Plan: Extubation in OR  Informed Consent: I have reviewed the patients History and Physical, chart, labs and discussed the procedure including the risks, benefits and alternatives for the proposed anesthesia with the patient or authorized representative who has indicated his/her understanding and acceptance.   Dental advisory given  Plan Discussed with: CRNA and Anesthesiologist  Anesthesia Plan Comments:        Anesthesia Quick Evaluation

## 2015-12-17 ENCOUNTER — Encounter (HOSPITAL_BASED_OUTPATIENT_CLINIC_OR_DEPARTMENT_OTHER): Payer: Self-pay | Admitting: *Deleted

## 2015-12-17 ENCOUNTER — Ambulatory Visit (HOSPITAL_BASED_OUTPATIENT_CLINIC_OR_DEPARTMENT_OTHER): Payer: 59 | Admitting: Anesthesiology

## 2015-12-17 ENCOUNTER — Encounter (HOSPITAL_BASED_OUTPATIENT_CLINIC_OR_DEPARTMENT_OTHER)
Admission: RE | Disposition: A | Discharge status: Home / Self Care | Payer: Self-pay | Source: Ambulatory Visit | Attending: Urology

## 2015-12-17 ENCOUNTER — Ambulatory Visit (HOSPITAL_BASED_OUTPATIENT_CLINIC_OR_DEPARTMENT_OTHER)
Admission: RE | Admit: 2015-12-17 | Discharge: 2015-12-17 | Disposition: A | Discharge status: Home / Self Care | Payer: 59 | Source: Ambulatory Visit | Attending: Urology | Admitting: Urology

## 2015-12-17 DIAGNOSIS — Z880 Allergy status to penicillin: Secondary | ICD-10-CM | POA: Diagnosis not present

## 2015-12-17 DIAGNOSIS — Z8349 Family history of other endocrine, nutritional and metabolic diseases: Secondary | ICD-10-CM | POA: Diagnosis not present

## 2015-12-17 DIAGNOSIS — N200 Calculus of kidney: Secondary | ICD-10-CM | POA: Insufficient documentation

## 2015-12-17 DIAGNOSIS — Z7982 Long term (current) use of aspirin: Secondary | ICD-10-CM | POA: Diagnosis not present

## 2015-12-17 DIAGNOSIS — Z79899 Other long term (current) drug therapy: Secondary | ICD-10-CM | POA: Diagnosis not present

## 2015-12-17 DIAGNOSIS — Z841 Family history of disorders of kidney and ureter: Secondary | ICD-10-CM | POA: Insufficient documentation

## 2015-12-17 DIAGNOSIS — Z881 Allergy status to other antibiotic agents status: Secondary | ICD-10-CM | POA: Insufficient documentation

## 2015-12-17 DIAGNOSIS — Z888 Allergy status to other drugs, medicaments and biological substances status: Secondary | ICD-10-CM | POA: Diagnosis not present

## 2015-12-17 DIAGNOSIS — Z885 Allergy status to narcotic agent status: Secondary | ICD-10-CM | POA: Insufficient documentation

## 2015-12-17 DIAGNOSIS — I341 Nonrheumatic mitral (valve) prolapse: Secondary | ICD-10-CM | POA: Diagnosis not present

## 2015-12-17 HISTORY — PX: CYSTOSCOPY WITH RETROGRADE PYELOGRAM, URETEROSCOPY AND STENT PLACEMENT: SHX5789

## 2015-12-17 HISTORY — PX: HOLMIUM LASER APPLICATION: SHX5852

## 2015-12-17 LAB — POCT HEMOGLOBIN-HEMACUE: Hemoglobin: 12.4 g/dL (ref 12.0–15.0)

## 2015-12-17 SURGERY — CYSTOURETEROSCOPY, WITH RETROGRADE PYELOGRAM AND STENT INSERTION
Anesthesia: General | Site: Ureter | Laterality: Left

## 2015-12-17 MED ORDER — ONDANSETRON HCL 4 MG/2ML IJ SOLN
INTRAMUSCULAR | Status: AC
  Filled 2015-12-17: qty 2

## 2015-12-17 MED ORDER — ONDANSETRON HCL 4 MG/2ML IJ SOLN
INTRAMUSCULAR | Status: DC | PRN
  Administered 2015-12-17: 4 mg via INTRAVENOUS

## 2015-12-17 MED ORDER — SODIUM CHLORIDE 0.9 % IR SOLN
Status: DC | PRN
  Administered 2015-12-17: 4000 mL

## 2015-12-17 MED ORDER — HYDROMORPHONE HCL 1 MG/ML IJ SOLN
0.2500 mg | INTRAMUSCULAR | Status: DC | PRN
  Filled 2015-12-17: qty 0.5

## 2015-12-17 MED ORDER — DEXAMETHASONE SODIUM PHOSPHATE 10 MG/ML IJ SOLN
INTRAMUSCULAR | Status: AC
  Filled 2015-12-17: qty 1

## 2015-12-17 MED ORDER — MIDAZOLAM HCL 2 MG/2ML IJ SOLN
INTRAMUSCULAR | Status: AC
  Filled 2015-12-17: qty 2

## 2015-12-17 MED ORDER — URELLE 81 MG PO TABS
1.0000 | ORAL_TABLET | Freq: Four times a day (QID) | ORAL | Status: DC
  Administered 2015-12-17: 11:00:00 via ORAL
  Filled 2015-12-17: qty 1

## 2015-12-17 MED ORDER — IOHEXOL 300 MG/ML  SOLN: INTRAMUSCULAR | Status: DC | PRN

## 2015-12-17 MED ORDER — URELLE 81 MG PO TABS
ORAL_TABLET | ORAL | Status: AC
  Filled 2015-12-17: qty 1

## 2015-12-17 MED ORDER — LACTATED RINGERS IV SOLN
INTRAVENOUS | Status: DC
  Administered 2015-12-17 (×2): via INTRAVENOUS
  Filled 2015-12-17: qty 1000

## 2015-12-17 MED ORDER — CEFAZOLIN SODIUM-DEXTROSE 2-4 GM/100ML-% IV SOLN
INTRAVENOUS | Status: AC
  Filled 2015-12-17: qty 100

## 2015-12-17 MED ORDER — CEFAZOLIN SODIUM-DEXTROSE 2-4 GM/100ML-% IV SOLN
2.0000 g | Freq: Once | INTRAVENOUS | Status: AC
  Administered 2015-12-17: 2 g via INTRAVENOUS
  Filled 2015-12-17: qty 100

## 2015-12-17 MED ORDER — LIDOCAINE 2% (20 MG/ML) 5 ML SYRINGE
INTRAMUSCULAR | Status: DC | PRN
  Administered 2015-12-17: 60 mg via INTRAVENOUS

## 2015-12-17 MED ORDER — URIBEL 118 MG PO CAPS: 1.0000 | ORAL_CAPSULE | Freq: Three times a day (TID) | ORAL | 1 refills | Status: DC | PRN

## 2015-12-17 MED ORDER — PROMETHAZINE HCL 25 MG/ML IJ SOLN
6.2500 mg | Freq: Once | INTRAMUSCULAR | Status: AC
Start: 2015-12-17 — End: 2015-12-17
  Administered 2015-12-17: 6.25 mg via INTRAVENOUS
  Filled 2015-12-17: qty 1

## 2015-12-17 MED ORDER — HYDROCODONE-ACETAMINOPHEN 5-325 MG PO TABS: 1.0000 | ORAL_TABLET | Freq: Four times a day (QID) | ORAL | 0 refills | Status: DC | PRN

## 2015-12-17 MED ORDER — KETOROLAC TROMETHAMINE 30 MG/ML IJ SOLN
INTRAMUSCULAR | Status: DC | PRN
  Administered 2015-12-17: 30 mg via INTRAVENOUS

## 2015-12-17 MED ORDER — LIDOCAINE 2% (20 MG/ML) 5 ML SYRINGE
INTRAMUSCULAR | Status: AC
  Filled 2015-12-17: qty 10

## 2015-12-17 MED ORDER — FENTANYL CITRATE (PF) 100 MCG/2ML IJ SOLN
INTRAMUSCULAR | Status: DC | PRN
  Administered 2015-12-17: 50 ug via INTRAVENOUS

## 2015-12-17 MED ORDER — LIDOCAINE HCL 2 % EX GEL
CUTANEOUS | Status: DC | PRN
  Administered 2015-12-17: 1 via URETHRAL

## 2015-12-17 MED ORDER — PROPOFOL 10 MG/ML IV BOLUS
INTRAVENOUS | Status: DC | PRN
  Administered 2015-12-17: 100 mg via INTRAVENOUS
  Administered 2015-12-17: 50 mg via INTRAVENOUS

## 2015-12-17 MED ORDER — PROMETHAZINE HCL 25 MG/ML IJ SOLN
INTRAMUSCULAR | Status: AC
  Filled 2015-12-17: qty 1

## 2015-12-17 MED ORDER — DEXAMETHASONE SODIUM PHOSPHATE 4 MG/ML IJ SOLN
INTRAMUSCULAR | Status: DC | PRN
  Administered 2015-12-17: 10 mg via INTRAVENOUS

## 2015-12-17 MED ORDER — MIDAZOLAM HCL 5 MG/5ML IJ SOLN
INTRAMUSCULAR | Status: DC | PRN
  Administered 2015-12-17: 1 mg via INTRAVENOUS

## 2015-12-17 MED ORDER — FENTANYL CITRATE (PF) 100 MCG/2ML IJ SOLN
INTRAMUSCULAR | Status: AC
  Filled 2015-12-17: qty 2

## 2015-12-17 SURGICAL SUPPLY — 33 items
BAG DRAIN URO-CYSTO SKYTR STRL (DRAIN) ×3 IMPLANT
BASKET LASER NITINOL 1.9FR (BASKET) IMPLANT
BASKET STNLS GEMINI 4WIRE 3FR (BASKET) IMPLANT
BASKET ZERO TIP NITINOL 2.4FR (BASKET) ×3 IMPLANT
BENZOIN TINCTURE PRP APPL 2/3 (GAUZE/BANDAGES/DRESSINGS) ×3 IMPLANT
CATH INTERMIT  6FR 70CM (CATHETERS) ×3 IMPLANT
CATH URET 5FR 28IN CONE TIP (BALLOONS)
CATH URET 5FR 28IN OPEN ENDED (CATHETERS) IMPLANT
CATH URET 5FR 70CM CONE TIP (BALLOONS) IMPLANT
CLOTH BEACON ORANGE TIMEOUT ST (SAFETY) ×3 IMPLANT
DRSG TEGADERM 2-3/8X2-3/4 SM (GAUZE/BANDAGES/DRESSINGS) IMPLANT
FIBER LASER TRAC TIP (UROLOGICAL SUPPLIES) ×3 IMPLANT
GLOVE BIO SURGEON STRL SZ7.5 (GLOVE) ×3 IMPLANT
GLOVE INDICATOR 7.5 STRL GRN (GLOVE) ×6 IMPLANT
GOWN STRL REUS W/ TWL XL LVL3 (GOWN DISPOSABLE) ×1 IMPLANT
GOWN STRL REUS W/TWL LRG LVL3 (GOWN DISPOSABLE) ×3 IMPLANT
GOWN STRL REUS W/TWL XL LVL3 (GOWN DISPOSABLE) ×2
GUIDEWIRE 0.038 PTFE COATED (WIRE) IMPLANT
GUIDEWIRE ANG ZIPWIRE 038X150 (WIRE) IMPLANT
GUIDEWIRE STR DUAL SENSOR (WIRE) IMPLANT
IV NS IRRIG 3000ML ARTHROMATIC (IV SOLUTION) ×6 IMPLANT
KIT BALLIN UROMAX 15FX10 (LABEL) IMPLANT
KIT BALLN UROMAX 15FX4 (MISCELLANEOUS) IMPLANT
KIT BALLN UROMAX 26 75X4 (MISCELLANEOUS)
KIT ROOM TURNOVER WOR (KITS) ×3 IMPLANT
MANIFOLD NEPTUNE II (INSTRUMENTS) ×3 IMPLANT
NS IRRIG 500ML POUR BTL (IV SOLUTION) IMPLANT
PACK CYSTO (CUSTOM PROCEDURE TRAY) ×3 IMPLANT
SET HIGH PRES BAL DIL (LABEL)
SHEATH ACCESS URETERAL 38CM (SHEATH) ×3 IMPLANT
STENT URET 6FRX24 CONTOUR (STENTS) ×3 IMPLANT
TUBE CONNECTING 12'X1/4 (SUCTIONS) ×1
TUBE CONNECTING 12X1/4 (SUCTIONS) ×2 IMPLANT

## 2015-12-17 NOTE — Anesthesia Postprocedure Evaluation (Signed)
Anesthesia Post Note  Patient: Angela Olson  Procedure(s) Performed: Procedure(s) (LRB): CYSTOSCOPY WITH RETROGRADE PYELOGRAM, URETEROSCOPY AND STENT PLACEMENT (Left) HOLMIUM LASER APPLICATION (Left)  Patient location during evaluation: PACU Anesthesia Type: General Level of consciousness: awake and alert Pain management: pain level controlled Vital Signs Assessment: post-procedure vital signs reviewed and stable Respiratory status: spontaneous breathing, nonlabored ventilation and respiratory function stable Cardiovascular status: blood pressure returned to baseline and stable Postop Assessment: no signs of nausea or vomiting Anesthetic complications: no    Last Vitals:  Vitals:   12/17/15 1015 12/17/15 1045  BP: 128/74 122/69  Pulse: (!) 55 (!) 50  Resp: 15 13  Temp:      Last Pain:  Vitals:   12/17/15 0930  TempSrc:   PainSc: Asleep                 Morine Kohlman,W. EDMOND

## 2015-12-17 NOTE — Anesthesia Procedure Notes (Signed)
Procedure Name: LMA Insertion Date/Time: 12/17/2015 8:36 AM Performed by: Tyrone NineSAUVE, Talal Fritchman F Pre-anesthesia Checklist: Patient identified, Timeout performed, Emergency Drugs available, Suction available and Patient being monitored Patient Re-evaluated:Patient Re-evaluated prior to inductionOxygen Delivery Method: Circle system utilized Preoxygenation: Pre-oxygenation with 100% oxygen Intubation Type: IV induction Ventilation: Mask ventilation without difficulty LMA: LMA inserted LMA Size: 3.0 Number of attempts: 2 (Pt with small mouth/ suggest using #3 LMA) Placement Confirmation: breath sounds checked- equal and bilateral and positive ETCO2 Tube secured with: Tape Dental Injury: Teeth and Oropharynx as per pre-operative assessment

## 2015-12-17 NOTE — H&P (Signed)
Reason for visit: General presents today for elective ureteroscopy to treat a left renal calculus.    CC/HPI: Angela Olson presents today to discuss the results of her metabolic stone profile and stone analysis.  in July 2017 she underwent her ureteroscopic procedure.She had a 5-6 mm impacted distal right ureteral stone at the ureteral orifice. She also had evidence on CT imaging from the beach and KUB of having bilateral intrarenal calculi. Our plan was to treat the distal right ureteral stone and then make an attempt at a right renal stone. The distal stone was treated without difficulty. Flexible ureteroscopy revealed no obvious evidence of a stone within the right collecting system.   Retrograde pyelography we could see that the 6-7 mm calcification was clearly outside of the collecting system. It remains unclear whether this is a parenchymal calcification or potentially a stone and a small minor calyx with a very tight infundibulum. In any event we felt that the stone strands of migration was essentially 0. I did perform left retrograde pyelography but was unable to determine whether the calcification did indeed reside within the collecting system or not. I have recommended observation   Stone analysis was calcium oxalate with 70% monohydrate form on her urinary assessment she had 24-hour urine volumes in the 2.3-2.9 L range. Urinary calcium levels were normal. Oxalate levels were borderline elevated at 39 and 43. She had excellent urinary citrate levels. She did have a relatively high urine pH.     ALLERGIES: Amoxicillin Hydrocodone Tetracycline Zithromax    MEDICATIONS: Tamsulosin Hcl 0.4 mg capsule, ext release 24 hr 1 capsule PO Daily  Allergy  Tylenol Extra Strength 500 mg tablet     GU PSH: Cysto/Uretero W/Lithotripsy, Right - 09/03/2015    NON-GU PSH: Anesth, Ear Surgery - 2007    GU PMH: Calculus Kidney and Ureter - 09/10/2015, - 09/01/2015 Calculus Ureter - 08/25/2015    NON-GU  PMH: Cardiac murmur, unspecified    FAMILY HISTORY: Kidney Stones - Father Thyroid Disorder - Mother   SOCIAL HISTORY: Marital Status: Married Current Smoking Status: Patient has never smoked.  Has never drank.  Does not drink caffeine.    REVIEW OF SYSTEMS:    GU Review Female:   Patient reports frequent urination, hard to postpone urination, burning /pain with urination, and get up at night to urinate. Patient denies leakage of urine, stream starts and stops, trouble starting your stream, have to strain to urinate, and currently pregnant.  Gastrointestinal (Upper):   Patient denies nausea, vomiting, and indigestion/ heartburn.  Gastrointestinal (Lower):   Patient denies diarrhea and constipation.  Constitutional:   Patient denies fever, night sweats, weight loss, and fatigue.  Skin:   Patient denies skin rash/ lesion and itching.  Eyes:   Patient denies blurred vision and double vision.  Ears/ Nose/ Throat:   Patient reports sinus problems. Patient denies sore throat.  Hematologic/Lymphatic:   Patient denies swollen glands and easy bruising.  Cardiovascular:   Patient denies leg swelling and chest pains.  Respiratory:   Patient denies cough and shortness of breath.  Endocrine:   Patient denies excessive thirst.  Musculoskeletal:   Patient denies joint pain and back pain.  Neurological:   Patient denies headaches and dizziness.  Psychologic:   Patient denies depression and anxiety.   VITAL SIGNS:     BP 96/64 mmHg  Pulse 67 /min   Well-developed well-nourished female in no acute distress  Respiratory: Normal effort Cardiac: Regular rate and rhythm Abdomen: Soft nontender no  CVA tenderness, no palpable masses Neuro: Nonfocal  PAST DATA REVIEWED:  Source Of History:  Patient   PROCEDURES:          Urinalysis - 81003 Dipstick Dipstick Cont'd  Specimen: Voided Bilirubin: Neg  Color: Straw Ketones: Neg  Appearance: Clear Blood: Neg  Specific Gravity: <= 1.005 Protein:  Neg  pH: 6.5 Urobilinogen: 0.2  Glucose: Neg Nitrites: Neg    Leukocyte Esterase: Neg    ASSESSMENT:      ICD-10 Details  1 GU:   Calculus Kidney and Ureter - N20.2      PLAN:           Schedule Return Visit: 2-3 weeks - Schedule Surgery          Document Letter(s):  Created for Patient: Clinical Summary         Notes:   today we went over her stone composition and her 24-hour metabolic studies. I've recommended ongoing increased fluid intake and she's had good urinary volumes. She should continue to reduce oxalate consumption and watch her salt intake as well. Again she has a right renal stone that appears to be within a calyceal diverticulum/parenchyma. It is less clear with a left renal stone that mmeasures approximately 7-8 mm. She is interested in definitive attempted treatment for a left stone. We specifically discussed today the pros and cons of ESWL versus flexible ureteroscopy. She would prefer an endoscopic approach.  She presents today for this procedure.  Risks benefits of the surgery were discussed with her at the time of her initial consultation.

## 2015-12-17 NOTE — Interval H&P Note (Signed)
History and Physical Interval Note:  12/17/2015 8:25 AM  Angela Olson  has presented today for surgery, with the diagnosis of LEFT RENAL CALCULUS  The various methods of treatment have been discussed with the patient and family. After consideration of risks, benefits and other options for treatment, the patient has consented to  Procedure(s): CYSTOSCOPY WITH RETROGRADE PYELOGRAM, URETEROSCOPY AND STENT PLACEMENT (Left) HOLMIUM LASER APPLICATION (Left) as a surgical intervention .  The patient's history has been reviewed, patient examined, no change in status, stable for surgery.  I have reviewed the patient's chart and labs.  Questions were answered to the patient's satisfaction.     Ferlando Lia S

## 2015-12-17 NOTE — Transfer of Care (Signed)
Immediate Anesthesia Transfer of Care Note  Patient: Sula RumpleJanet D Dearing  Procedure(s) Performed: Procedure(s): CYSTOSCOPY WITH RETROGRADE PYELOGRAM, URETEROSCOPY AND STENT PLACEMENT (Left) HOLMIUM LASER APPLICATION (Left)  Patient Location: PACU  Anesthesia Type:General  Level of Consciousness: awake, alert , oriented and patient cooperative  Airway & Oxygen Therapy: Patient Spontanous Breathing and Patient connected to nasal cannula oxygen  Post-op Assessment: Report given to RN and Post -op Vital signs reviewed and stable  Post vital signs: Reviewed and stable  Last Vitals:  Vitals:   12/17/15 0715  BP: 111/70  Pulse: (!) 59  Resp: 16  Temp: 36.9 C    Last Pain:  Vitals:   12/17/15 0715  TempSrc: Oral      Patients Stated Pain Goal: 6 (12/17/15 0733)  Complications: No apparent anesthesia complications

## 2015-12-17 NOTE — Discharge Instructions (Addendum)
DISCHARGE INSTRUCTIONS FOR KIDNEY STONES OR URETERAL STENT  MEDICATIONS:   1. DO NOT RESUME YOUR ASPIRIN, or any other medicines like ibuprofen, motrin, excedrin, advil, aleve, vitamin E, fish oil as these can all cause bleeding x 7 days.  2. Resume all your other meds from home - except do not take any other pain meds that you may have at home.  ACTIVITY 1. No strenuous activity x 1week 2. No driving while on narcotic pain medications 3. Drink plenty of water 4. Continue to walk at home - you can still get blood clots when you are at home, so keep active, but don't over do it. 5. May return to work in 3 days.  BATHING 1. You can shower and we recommend daily showers  2. If you have a string coming from your urethra:  The stent string is attached to your ureteral stent.  Do not pull on this.   SIGNS/SYMPTOMS TO CALL: 1. Please call us if you have a fever greater than 101.5, uncontrolled  nausea/vomiting, uncontrolled pain, dizziness, unable to urinate, bloody urine, chest pain, shortness of breath, leg swelling, leg pain, redness around wound, drainage from wound, or any other concerns or questions.  You can reach us at [phone].  FOLLOW-UP 1. You will need to make a follow-up appointment for about a month. This can be scheduled with one of our nurse practitioners. 2. If you have a string attached to your stent, you may remove it on Saturday.  To do this, pull the string until the stent is completely removed.  You may feel an odd sensation in your back. 3.   Post Anesthesia Home Care Instructions  Activity: Get plenty of rest for the remainder of the day. A responsible adult should stay with you for 24 hours following the procedure.  For the next 24 hours, DO NOT: -Drive a car -Advertising copywriterperate machinery -Drink alcoholic beverages -Take any medication unless instructed by your physician -Make any legal decisions or sign important papers.  Meals: Start with liquid foods such as  gelatin or soup. Progress to regular foods as tolerated. Avoid greasy, spicy, heavy foods. If nausea and/or vomiting occur, drink only clear liquids until the nausea and/or vomiting subsides. Call your physician if vomiting continues.  Special Instructions/Symptoms: Your throat may feel dry or sore from the anesthesia or the breathing tube placed in your throat during surgery. If this causes discomfort, gargle with warm salt water. The discomfort should disappear within 24 hours.  If you had a scopolamine patch placed behind your ear for the management of post- operative nausea and/or vomiting:  1. The medication in the patch is effective for 72 hours, after which it should be removed.  Wrap patch in a tissue and discard in the trash. Wash hands thoroughly with soap and water. 2. You may remove the patch earlier than 72 hours if you experience unpleasant side effects which may include dry mouth, dizziness or visual disturbances. 3. Avoid touching the patch. Wash your hands with soap and water after contact with the patch.

## 2015-12-17 NOTE — Op Note (Signed)
Preoperative diagnosis: Left renal calculus Postoperative diagnosis: Same  Procedure: Cystoscopy, left flexible ureteroscopy, holmium laser lithotripsy, basketing of stone fragments, left double-J stent placement  Surgeon: Valetta Fulleravid S. Clara Smolen M.D.  Anesthesia: Gen.  Indications: Ms. Orson SlickHarmon has had a recent episode of nephrolithiasis involving a distal right stone. She was noted at that time to have an asymptomatic nonobstructing 8 mm left renal calculus. Stone is extremely dense and was very hard at the time of initial ureteroscopy. She requested elective treatment of her nonobstructing 8 mm left renal stone. We felt ureteroscopy was a better option than ESWL given the density of the stone. She presents now for that procedure. Full informed consent has been obtained.     Technique and findings: This brought the operating room where she had successful induction of general anesthesia. She was placed in lithotomy position and prepped and draped in usual manner. Cystoscopy revealed unremarkable bladder. On fluoroscopy the stone was easily visualized and therefore retrograde pyelography was not felt to be necessary. Appropriate surgical timeout was performed prior to initiation of the procedure. A guidewire was placed up to the renal pelvis. Over the guidewire we placed a access sheath with no difficulty. Digital flexible ureteroscopy was then performed an 8 mm stone was encountered in a midpole calyx. Holmium laser lithotripter was utilized at 0.8 J 8 Hz. The stone was fractured into a proximally 8-10 pieces. These pieces were all basket extracted. No significant residual stone fragments were noted. Over the guidewire I placed a 6 French 24 cm stent with a dangle string attached to the patient's inner thigh. No obvious complications occurred the patient was brought to PACU in stable condition.

## 2015-12-19 ENCOUNTER — Encounter (HOSPITAL_BASED_OUTPATIENT_CLINIC_OR_DEPARTMENT_OTHER): Payer: Self-pay | Admitting: Urology

## 2015-12-30 ENCOUNTER — Ambulatory Visit
Admission: RE | Admit: 2015-12-30 | Discharge: 2015-12-30 | Disposition: A | Discharge status: Home / Self Care | Payer: 59 | Source: Ambulatory Visit | Attending: Obstetrics and Gynecology | Admitting: Obstetrics and Gynecology

## 2015-12-30 DIAGNOSIS — Z1231 Encounter for screening mammogram for malignant neoplasm of breast: Secondary | ICD-10-CM | POA: Diagnosis not present

## 2016-09-14 ENCOUNTER — Other Ambulatory Visit: Payer: Self-pay | Admitting: Obstetrics and Gynecology

## 2016-10-25 ENCOUNTER — Ambulatory Visit: Payer: Self-pay | Admitting: Obstetrics and Gynecology

## 2016-10-26 ENCOUNTER — Encounter: Payer: Self-pay | Admitting: Obstetrics and Gynecology

## 2016-10-26 ENCOUNTER — Ambulatory Visit (INDEPENDENT_AMBULATORY_CARE_PROVIDER_SITE_OTHER): Payer: 59 | Admitting: Obstetrics and Gynecology

## 2016-10-26 VITALS — BP 102/58 | HR 69 | Ht 64.0 in | Wt 122.0 lb

## 2016-10-26 DIAGNOSIS — Z1239 Encounter for other screening for malignant neoplasm of breast: Secondary | ICD-10-CM

## 2016-10-26 DIAGNOSIS — Z1321 Encounter for screening for nutritional disorder: Secondary | ICD-10-CM | POA: Diagnosis not present

## 2016-10-26 DIAGNOSIS — Z1231 Encounter for screening mammogram for malignant neoplasm of breast: Secondary | ICD-10-CM | POA: Diagnosis not present

## 2016-10-26 DIAGNOSIS — Z01419 Encounter for gynecological examination (general) (routine) without abnormal findings: Secondary | ICD-10-CM | POA: Diagnosis not present

## 2016-10-26 DIAGNOSIS — Z1211 Encounter for screening for malignant neoplasm of colon: Secondary | ICD-10-CM | POA: Diagnosis not present

## 2016-10-26 DIAGNOSIS — Z0000 Encounter for general adult medical examination without abnormal findings: Secondary | ICD-10-CM | POA: Diagnosis not present

## 2016-10-26 NOTE — Progress Notes (Signed)
PCP: Steele Sizer, MD   Chief Complaint  Patient presents with  . Gynecologic Exam    HPI:      [name] is a 56 y.o. G1P1001 who LMP was No LMP recorded. Patient is postmenopausal., presents today for her annual examination.  Her menses are absent due to menopause. She does not have intermenstrual bleeding. She does have vasomotor sx that are tolerable.  Sex activity: single partner, contraception - post menopausal status. She does have vaginal dryness. She is using lubricants without much relief. She has not tried coconut oil.   Last Pap: October 21, 2015  Results were: no abnormalities /neg HPV DNA.  Hx of STDs: none  Last mammogram: 12/30/15 Results were: normal--routine follow-up in 12 months There is a FH of breast cancer in her mat aunt, genetic testing not indicated. There is no FH of ovarian cancer. The patient does not do self-breast exams.  Colonoscopy: colonoscopy 5 years ago without abnormalities. . Repeat due after 5-10 years--pt unsure.   Tobacco use: The patient denies current or previous tobacco use. Alcohol use: none Exercise: moderately active  She does get adequate calcium and Vitamin D in her diet. She was diagnosed with kidney stones this yr (calcium oxalate) and is having to restrict her diet. She is taking Vit D supp and getting calcium in diet. She was Vit D deficient last yr. She had normal lipids at work.   Past Medical History:  Diagnosis Date  . Calculus of kidney 2017  . Enlarged lymph node    right goin  . Frequency of urination   . Heart murmur   . History of rheumatic fever   . History of scarlet fever   . 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 Nephrolithiasis    left  . Sensation of pressure in bladder area   . Urgency of urination     Past Surgical History:  Procedure Laterality Date  . BREAST BIOPSY Left 2014    . COLONOSCOPY  06/2012  . CYSTOSCOPY W/ RETROGRADES Bilateral 09/03/2015   Procedure: CYSTOSCOPY WITH RETROGRADE PYELOGRAM;  Surgeon: Barron Alvine, MD;  Location: [facility];  Service: Urology;  Laterality: Bilateral;  . CYSTOSCOPY WITH HOLMIUM LASER LITHOTRIPSY Right 09/03/2015   Procedure: CYSTOSCOPY WITH HOLMIUM LASER LITHOTRIPSY;  Surgeon: Barron Alvine, MD;  Location: [facility];  Service: Urology;  Laterality: Right;  . CYSTOSCOPY WITH RETROGRADE PYELOGRAM, URETEROSCOPY AND STENT PLACEMENT Right 09/03/2015   Procedure: CYSTOSCOPY WITH RETROGRADE PYELOGRAM, URETEROSCOPY AND STENT PLACEMENT;  Surgeon: Barron Alvine, MD;  Location: [facility];  Service: Urology;  Laterality: Right;  1 HOUR  . CYSTOSCOPY WITH RETROGRADE PYELOGRAM, URETEROSCOPY AND STENT PLACEMENT Left 12/17/2015   Procedure: CYSTOSCOPY WITH RETROGRADE PYELOGRAM, URETEROSCOPY AND STENT PLACEMENT;  Surgeon: Barron Alvine, MD;  Location: [facility];  Service: Urology;  Laterality: Left;  . EXCISION LEFT BREAST CYST  01/2003   benign  . HOLMIUM LASER APPLICATION Left 12/17/2015   Procedure: HOLMIUM LASER APPLICATION;  Surgeon: Barron Alvine, MD;  Location: [facility];  Service: Urology;  Laterality: Left;  . STAPEDES SURGERY  x2 last one 2009   inner ear  . STONE EXTRACTION WITH BASKET Right 09/03/2015   Procedure: STONE EXTRACTION WITH BASKET;  Surgeon: Barron Alvine, MD;  Location: [facility];  Service: Urology;  Laterality: Right;    Family History  Problem Relation Age of Onset  . Hypertension Father   . Thyroid disease Mother   . Breast cancer Maternal Aunt 60  . Diabetes Maternal Aunt   . Leukemia Paternal Uncle 55  . ALS Paternal Aunt 74  . ALS Cousin 79       son of affected aunt    Social History   Social History  . Marital status: Married    Spouse name: N/A  . Number of children: N/A  . Years of education: N/A    Occupational History  . Not on file.   Social History Main Topics  . Smoking status: Never Smoker  . Smokeless tobacco: Never Used  . Alcohol use No  . Drug use: No  . Sexual activity: Yes    Birth control/ protection: Post-menopausal   Other Topics Concern  . Not on file   Social History Narrative  . No narrative on file    Current Meds  Medication Sig  . Cholecalciferol (VITAMIN D3) 2000 units TABS Take 1 tablet by mouth daily.  . Diphenhydramine-Pseudoephed (EQ ALLERGY/SINUS PO) Take 1 capsule by mouth every morning.  . fish oil-omega-3 fatty acids 1000 MG capsule Take 2 g by mouth daily.  Marland Kitchen ibuprofen (ADVIL,MOTRIN) 200 MG tablet Take 200 mg by mouth every 6 (six) hours as needed.      ROS:  Review of Systems  Constitutional: Negative for fatigue, fever and unexpected weight change.  Respiratory: Negative for cough, shortness of breath and wheezing.   Cardiovascular: Negative for chest pain, palpitations and leg swelling.  Gastrointestinal: Negative for blood in stool, constipation, diarrhea, nausea and vomiting.  Endocrine: Negative for cold intolerance, heat intolerance and polyuria.  Genitourinary: Negative for dyspareunia, dysuria, flank pain, frequency, genital sores, hematuria, menstrual problem, pelvic pain, urgency, vaginal bleeding, vaginal discharge and vaginal pain.  Musculoskeletal: Negative for back pain, joint swelling and myalgias.  Skin: Negative for rash.  Neurological: Negative for dizziness, syncope, light-headedness, numbness and headaches.  Hematological: Negative for adenopathy.  Psychiatric/Behavioral: Negative for agitation, confusion, sleep disturbance and suicidal ideas. The patient is not nervous/anxious.      Objective: BP (!) 102/58   Pulse 69   Ht  (1.626 m)   Wt 122 lb (55.3 kg)   BMI 20.94 kg/m    Physical Exam  Constitutional: She is oriented to person, place, and time. She appears well-developed and well-nourished.   Genitourinary: Vagina normal and uterus normal. There is no rash or tenderness on the right labia. There is no rash or tenderness on the left labia. No erythema or tenderness in the vagina. No vaginal discharge found. Right adnexum does not display mass and does not display tenderness. Left adnexum does not display mass and does not display tenderness. Cervix does not exhibit motion tenderness or polyp. Uterus is not enlarged or tender.  Neck: Normal range of motion. No thyromegaly present.  Cardiovascular: Normal rate, regular rhythm and normal heart sounds.   No murmur heard. Pulmonary/Chest: Effort normal and breath sounds normal. Right breast exhibits no mass, no nipple discharge, no skin change and no tenderness. Left breast exhibits no mass, no nipple discharge, no skin change and no tenderness.  Abdominal: Soft. There is no tenderness. There is no guarding.  Musculoskeletal: Normal range of motion.  Neurological: She is alert and oriented to person, place, and time. No cranial nerve deficit.  Psychiatric: She has a normal mood and affect. Her  behavior is normal.  Vitals reviewed.   Assessment/Plan:  Encounter for annual routine gynecological examination  Screening for breast cancer - Pt to sched mammo. - Plan: MM DIGITAL SCREENING BILATERAL  Screening for colon cancer - Pt to find out from GI when colonoscopy due.  Blood tests for routine general physical examination - Plan: Comprehensive metabolic panel, VITAMIN D 25 Hydroxy (Vit-D Deficiency, Fractures)  Encounter for vitamin deficiency screening - Plan: VITAMIN D 25 Hydroxy (Vit-D Deficiency, Fractures)          GYN counsel mammography screening, menopause, adequate intake of calcium and vitamin D, diet and exercise    F/U  Return in about 1 year (around 10/26/2017).  Alicia B. Copland, PA-C 10/26/2016 2:04 PM

## 2016-10-27 LAB — COMPREHENSIVE METABOLIC PANEL
A/G RATIO: 2.8 — AB (ref 1.2–2.2)
ALBUMIN: 4.7 g/dL (ref 3.5–5.5)
ALT: 11 IU/L (ref 0–32)
AST: 18 IU/L (ref 0–40)
Alkaline Phosphatase: 69 IU/L (ref 39–117)
BUN / CREAT RATIO: 17 (ref 9–23)
BUN: 12 mg/dL (ref 6–24)
Bilirubin Total: 0.4 mg/dL (ref 0.0–1.2)
CALCIUM: 9.5 mg/dL (ref 8.7–10.2)
CO2: 29 mmol/L (ref 20–29)
CREATININE: 0.71 mg/dL (ref 0.57–1.00)
Chloride: 101 mmol/L (ref 96–106)
GFR, EST AFRICAN AMERICAN: 110 mL/min/{1.73_m2} (ref >59)
GFR, EST NON AFRICAN AMERICAN: 96 mL/min/{1.73_m2} (ref >59)
GLOBULIN, TOTAL: 1.7 g/dL (ref 1.5–4.5)
Glucose: 73 mg/dL (ref 65–99)
POTASSIUM: 3.9 mmol/L (ref 3.5–5.2)
SODIUM: 141 mmol/L (ref 134–144)
TOTAL PROTEIN: 6.4 g/dL (ref 6.0–8.5)

## 2016-10-27 LAB — VITAMIN D 25 HYDROXY (VIT D DEFICIENCY, FRACTURES): Vit D, 25-Hydroxy: 30.6 ng/mL (ref 30.0–100.0)

## 2016-10-28 ENCOUNTER — Encounter: Payer: Self-pay | Admitting: Obstetrics and Gynecology

## 2016-12-31 ENCOUNTER — Ambulatory Visit
Admission: RE | Admit: 2016-12-31 | Discharge: 2016-12-31 | Disposition: A | Discharge status: Home / Self Care | Payer: 59 | Source: Ambulatory Visit | Attending: Obstetrics and Gynecology | Admitting: Obstetrics and Gynecology

## 2016-12-31 DIAGNOSIS — Z1239 Encounter for other screening for malignant neoplasm of breast: Secondary | ICD-10-CM

## 2016-12-31 DIAGNOSIS — Z1231 Encounter for screening mammogram for malignant neoplasm of breast: Secondary | ICD-10-CM | POA: Diagnosis not present

## 2017-01-01 ENCOUNTER — Encounter: Payer: Self-pay | Admitting: Obstetrics and Gynecology

## 2017-12-01 ENCOUNTER — Other Ambulatory Visit: Payer: Self-pay | Admitting: Obstetrics and Gynecology

## 2017-12-01 DIAGNOSIS — Z1231 Encounter for screening mammogram for malignant neoplasm of breast: Secondary | ICD-10-CM

## 2017-12-27 ENCOUNTER — Ambulatory Visit (INDEPENDENT_AMBULATORY_CARE_PROVIDER_SITE_OTHER): Payer: Managed Care, Other (non HMO) | Admitting: Obstetrics and Gynecology

## 2017-12-27 ENCOUNTER — Encounter: Payer: Self-pay | Admitting: Obstetrics and Gynecology

## 2017-12-27 ENCOUNTER — Other Ambulatory Visit (HOSPITAL_COMMUNITY)
Admission: RE | Admit: 2017-12-27 | Discharge: 2017-12-27 | Disposition: A | Discharge status: Home / Self Care | Payer: Managed Care, Other (non HMO) | Source: Ambulatory Visit | Attending: Obstetrics and Gynecology | Admitting: Obstetrics and Gynecology

## 2017-12-27 VITALS — BP 104/60 | HR 73 | Ht 64.0 in | Wt 122.0 lb

## 2017-12-27 DIAGNOSIS — Z0000 Encounter for general adult medical examination without abnormal findings: Secondary | ICD-10-CM

## 2017-12-27 DIAGNOSIS — Z01419 Encounter for gynecological examination (general) (routine) without abnormal findings: Secondary | ICD-10-CM | POA: Diagnosis not present

## 2017-12-27 DIAGNOSIS — Z124 Encounter for screening for malignant neoplasm of cervix: Secondary | ICD-10-CM

## 2017-12-27 DIAGNOSIS — N941 Unspecified dyspareunia: Secondary | ICD-10-CM

## 2017-12-27 DIAGNOSIS — N952 Postmenopausal atrophic vaginitis: Secondary | ICD-10-CM

## 2017-12-27 DIAGNOSIS — Z1239 Encounter for other screening for malignant neoplasm of breast: Secondary | ICD-10-CM

## 2017-12-27 MED ORDER — ESTRADIOL 0.1 MG/GM VA CREA
1.0000 | TOPICAL_CREAM | Freq: Every day | VAGINAL | 1 refills | Status: AC
Start: 2017-12-27 — End: ?

## 2017-12-27 NOTE — Progress Notes (Signed)
PCP: Steele Sizer, MD   Chief Complaint  Patient presents with  . Gynecologic Exam    HPI:      [name] is a 57 y.o. G1P1001 who LMP was No LMP recorded. Patient is postmenopausal., presents today for her annual examination.  Her menses are absent due to menopause. She does not have intermenstrual bleeding. She does not have vasomotor sx.  Sex activity: single partner, contraception - post menopausal status. She does have vaginal dryness. She tried coconut oil without much relief. Has not tried vag ERT.   Last Pap: October 21, 2015  Results were: no abnormalities /neg HPV DNA. Pt likes yearly paps since free through work. Hx of STDs: none  Last mammogram: 12/31/16 Results were: normal--routine follow-up in 12 months. Has appt 01/05/18. There is a FH of breast cancer in her mat aunt, genetic testing not indicated. There is no FH of ovarian cancer. The patient does not do self-breast exams.  Colonoscopy: colonoscopy 6 years ago without abnormalities. . Repeat due after 10 years--pt unsure.   Tobacco use: The patient denies current or previous tobacco use. Alcohol use: none Exercise: moderately active  She does get adequate calcium and Vitamin D in her diet. She was diagnosed with kidney stones this yr (calcium oxalate) and is having to restrict her diet. She is taking Vit D supp and getting calcium in diet. She was Vit D deficient last yr. She had normal lipids at work this yr and normal Vit D levels last yr. Pt still wants CMP to check calcium levels.    Past Medical History:  Diagnosis Date  . Calculus of kidney 2017  . Enlarged lymph node    right goin  . Frequency of urination   . Heart murmur   . History of rheumatic fever   . History of scarlet fever   . Kidney stones 2017  . 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 Nephrolithiasis    left  .  Sensation of pressure in bladder area   . Urgency of urination     Past Surgical History:  Procedure Laterality Date  . BREAST BIOPSY Left 2014  . COLONOSCOPY  06/2012  . CYSTOSCOPY W/ RETROGRADES Bilateral 09/03/2015   Procedure: CYSTOSCOPY WITH RETROGRADE PYELOGRAM;  Surgeon: Barron Alvine, MD;  Location: [facility];  Service: Urology;  Laterality: Bilateral;  . CYSTOSCOPY WITH HOLMIUM LASER LITHOTRIPSY Right 09/03/2015   Procedure: CYSTOSCOPY WITH HOLMIUM LASER LITHOTRIPSY;  Surgeon: Barron Alvine, MD;  Location: [facility];  Service: Urology;  Laterality: Right;  . CYSTOSCOPY WITH RETROGRADE PYELOGRAM, URETEROSCOPY AND STENT PLACEMENT Right 09/03/2015   Procedure: CYSTOSCOPY WITH RETROGRADE PYELOGRAM, URETEROSCOPY AND STENT PLACEMENT;  Surgeon: Barron Alvine, MD;  Location: [facility];  Service: Urology;  Laterality: Right;  1 HOUR  . CYSTOSCOPY WITH RETROGRADE PYELOGRAM, URETEROSCOPY AND STENT PLACEMENT Left 12/17/2015   Procedure: CYSTOSCOPY WITH RETROGRADE PYELOGRAM, URETEROSCOPY AND STENT PLACEMENT;  Surgeon: Barron Alvine, MD;  Location: [facility];  Service: Urology;  Laterality: Left;  . EXCISION LEFT BREAST CYST  01/2003   benign  . HOLMIUM LASER APPLICATION Left 12/17/2015   Procedure: HOLMIUM LASER APPLICATION;  Surgeon: Barron Alvine, MD;  Location: [facility];  Service: Urology;  Laterality: Left;  . STAPEDES SURGERY  x2 last one 2009  inner ear  . STONE EXTRACTION WITH BASKET Right 09/03/2015   Procedure: STONE EXTRACTION WITH BASKET;  Surgeon: Barron Alvine, MD;  Location: [facility];  Service: Urology;  Laterality: Right;    Family History  Problem Relation Age of Onset  . Hypertension Father   . Thyroid disease Mother   . Breast cancer Maternal Aunt 60  . Diabetes Maternal Aunt   . Leukemia Paternal Uncle 52  . ALS Paternal Aunt 55  . ALS Cousin 22       son of affected aunt     Social History   Socioeconomic History  . Marital status: Married    Spouse name: Not on file  . Number of children: Not on file  . Years of education: Not on file  . Highest education level: Not on file  Occupational History  . Not on file  Social Needs  . Financial resource strain: Not on file  . Food insecurity:    Worry: Not on file    Inability: Not on file  . Transportation needs:    Medical: Not on file    Non-medical: Not on file  Tobacco Use  . Smoking status: Never Smoker  . Smokeless tobacco: Never Used  Substance and Sexual Activity  . Alcohol use: No  . Drug use: No  . Sexual activity: Yes    Birth control/protection: Post-menopausal  Lifestyle  . Physical activity:    Days per week: Not on file    Minutes per session: Not on file  . Stress: Not on file  Relationships  . Social connections:    Talks on phone: Not on file    Gets together: Not on file    Attends religious service: Not on file    Active member of club or organization: Not on file    Attends meetings of clubs or organizations: Not on file    Relationship status: Not on file  . Intimate partner violence:    Fear of current or ex partner: Not on file    Emotionally abused: Not on file    Physically abused: Not on file    Forced sexual activity: Not on file  Other Topics Concern  . Not on file  Social History Narrative  . Not on file    Current Meds  Medication Sig  . Cholecalciferol (VITAMIN D3) 2000 units TABS Take 1 tablet by mouth daily.  . Diphenhydramine-Pseudoephed (EQ ALLERGY/SINUS PO) Take 1 capsule by mouth every morning.  . fish oil-omega-3 fatty acids 1000 MG capsule Take 2 g by mouth daily.      ROS:  Review of Systems  Constitutional: Negative for fatigue, fever and unexpected weight change.  Respiratory: Negative for cough, shortness of breath and wheezing.   Cardiovascular: Negative for chest pain, palpitations and leg swelling.  Gastrointestinal: Negative  for blood in stool, constipation, diarrhea, nausea and vomiting.  Endocrine: Negative for cold intolerance, heat intolerance and polyuria.  Genitourinary: Negative for dyspareunia, dysuria, flank pain, frequency, genital sores, hematuria, menstrual problem, pelvic pain, urgency, vaginal bleeding, vaginal discharge and vaginal pain.  Musculoskeletal: Negative for back pain, joint swelling and myalgias.  Skin: Negative for rash.  Neurological: Negative for dizziness, syncope, light-headedness, numbness and headaches.  Hematological: Negative for adenopathy.  Psychiatric/Behavioral: Negative for agitation, confusion, sleep disturbance and suicidal ideas. The patient is not nervous/anxious.      Objective: BP 104/60   Pulse 73   Ht 5\' 4"  (1.626 m)   Wt 122  lb (55.3 kg)   BMI 20.94 kg/m    Physical Exam  Constitutional: She is oriented to person, place, and time. She appears well-developed and well-nourished.  Genitourinary: Vagina normal and uterus normal. There is no rash or tenderness on the right labia. There is no rash or tenderness on the left labia. No erythema or tenderness in the vagina. No vaginal discharge found. Right adnexum does not display mass and does not display tenderness. Left adnexum does not display mass and does not display tenderness. Cervix does not exhibit motion tenderness or polyp. Uterus is not enlarged or tender.  Genitourinary Comments: MOD VAG ATROPHY; SMALL INTROITUS   Neck: Normal range of motion. No thyromegaly present.  Cardiovascular: Normal rate, regular rhythm and normal heart sounds.  No murmur heard. Pulmonary/Chest: Effort normal and breath sounds normal. Right breast exhibits no mass, no nipple discharge, no skin change and no tenderness. Left breast exhibits no mass, no nipple discharge, no skin change and no tenderness.  Abdominal: Soft. There is no tenderness. There is no guarding.  Musculoskeletal: Normal range of motion.  Neurological: She is  alert and oriented to person, place, and time. No cranial nerve deficit.  Psychiatric: She has a normal mood and affect. Her behavior is normal.  Vitals reviewed.   Assessment/Plan:  Encounter for annual routine gynecological examination  Cervical cancer screening - Plan: Cytology - PAP  Screening for breast cancer - Pt has mammo sched  Blood tests for routine general physical examination - Pt likes to keep check on calcium.  - Plan: Comprehensive metabolic panel  Dyspareunia in female - Pt with vag atrophy. Try estrace vag crm. Cont coconut oil as lubricant. F/u prn.  - Plan: estradiol (ESTRACE) 0.1 MG/GM vaginal cream  Postmenopausal atrophic vaginitis - Plan: estradiol (ESTRACE) 0.1 MG/GM vaginal cream          GYN counsel mammography screening, menopause, adequate intake of calcium and vitamin D, diet and exercise    F/U  Return in about 1 year (around 12/28/2018).  Kae Lauman B. Lister Brizzi, PA-C 12/27/2017 2:32 PM

## 2017-12-27 NOTE — Patient Instructions (Signed)
I value your feedback and entrusting us with your care. If you get a [facility] patient survey, I would appreciate you taking the time to let us know about your experience today. Thank you! 

## 2017-12-28 LAB — COMPREHENSIVE METABOLIC PANEL
ALBUMIN: 5 g/dL (ref 3.5–5.5)
ALK PHOS: 81 IU/L (ref 39–117)
ALT: 10 IU/L (ref 0–32)
AST: 16 IU/L (ref 0–40)
Albumin/Globulin Ratio: 2.3 — ABNORMAL HIGH (ref 1.2–2.2)
BUN/Creatinine Ratio: 23 (ref 9–23)
BUN: 16 mg/dL (ref 6–24)
Bilirubin Total: 0.4 mg/dL (ref 0.0–1.2)
CO2: 27 mmol/L (ref 20–29)
CREATININE: 0.71 mg/dL (ref 0.57–1.00)
Calcium: 9.5 mg/dL (ref 8.7–10.2)
Chloride: 100 mmol/L (ref 96–106)
GFR calc Af Amer: 109 mL/min/{1.73_m2} (ref >59)
GFR, EST NON AFRICAN AMERICAN: 95 mL/min/{1.73_m2} (ref >59)
GLUCOSE: 64 mg/dL — AB (ref 65–99)
Globulin, Total: 2.2 g/dL (ref 1.5–4.5)
Potassium: 3.7 mmol/L (ref 3.5–5.2)
SODIUM: 144 mmol/L (ref 134–144)
Total Protein: 7.2 g/dL (ref 6.0–8.5)

## 2017-12-28 NOTE — Progress Notes (Signed)
Called pt, no answer, LVMTRC. 

## 2017-12-28 NOTE — Progress Notes (Signed)
Pls let pt know CMP Wnl. Highlighted values are fine. Thx

## 2017-12-30 LAB — CYTOLOGY - PAP: Diagnosis: NEGATIVE

## 2018-01-05 ENCOUNTER — Encounter: Payer: Self-pay | Admitting: Obstetrics and Gynecology

## 2018-01-05 ENCOUNTER — Ambulatory Visit
Admission: RE | Admit: 2018-01-05 | Discharge: 2018-01-05 | Disposition: A | Discharge status: Home / Self Care | Payer: Managed Care, Other (non HMO) | Source: Ambulatory Visit | Attending: Obstetrics and Gynecology | Admitting: Obstetrics and Gynecology

## 2018-01-05 DIAGNOSIS — Z1231 Encounter for screening mammogram for malignant neoplasm of breast: Secondary | ICD-10-CM | POA: Insufficient documentation

## 2018-07-11 ENCOUNTER — Other Ambulatory Visit: Payer: Self-pay | Admitting: Obstetrics and Gynecology

## 2018-09-02 HISTORY — PX: BASAL CELL CARCINOMA EXCISION: SHX1214

## 2018-09-18 ENCOUNTER — Other Ambulatory Visit: Payer: Self-pay | Admitting: Unknown Physician Specialty

## 2018-09-18 DIAGNOSIS — R42 Dizziness and giddiness: Secondary | ICD-10-CM

## 2018-09-28 ENCOUNTER — Ambulatory Visit
Admission: RE | Admit: 2018-09-28 | Discharge: 2018-09-28 | Disposition: A | Discharge status: Home / Self Care | Payer: Managed Care, Other (non HMO) | Source: Ambulatory Visit | Attending: Unknown Physician Specialty | Admitting: Unknown Physician Specialty

## 2018-09-28 ENCOUNTER — Other Ambulatory Visit: Payer: Self-pay

## 2018-09-28 DIAGNOSIS — R42 Dizziness and giddiness: Secondary | ICD-10-CM | POA: Diagnosis present

## 2018-09-28 MED ORDER — GADOBUTROL 1 MMOL/ML IV SOLN
5.0000 mL | Freq: Once | INTRAVENOUS | Status: AC | PRN
  Administered 2018-09-28: 17:00:00 5 mL via INTRAVENOUS

## 2018-11-17 ENCOUNTER — Other Ambulatory Visit: Payer: Self-pay | Admitting: Neurology

## 2018-11-17 DIAGNOSIS — R200 Anesthesia of skin: Secondary | ICD-10-CM

## 2018-11-24 ENCOUNTER — Other Ambulatory Visit: Payer: Self-pay | Admitting: Obstetrics and Gynecology

## 2018-11-24 DIAGNOSIS — Z1231 Encounter for screening mammogram for malignant neoplasm of breast: Secondary | ICD-10-CM

## 2018-11-27 ENCOUNTER — Ambulatory Visit
Admission: RE | Admit: 2018-11-27 | Discharge: 2018-11-27 | Disposition: A | Discharge status: Home / Self Care | Payer: Managed Care, Other (non HMO) | Source: Ambulatory Visit | Attending: Neurology | Admitting: Neurology

## 2018-11-27 ENCOUNTER — Other Ambulatory Visit: Payer: Self-pay

## 2018-11-27 DIAGNOSIS — R200 Anesthesia of skin: Secondary | ICD-10-CM

## 2019-01-03 ENCOUNTER — Ambulatory Visit: Payer: Managed Care, Other (non HMO) | Admitting: Obstetrics and Gynecology

## 2019-01-09 ENCOUNTER — Ambulatory Visit: Payer: Managed Care, Other (non HMO)

## 2019-01-10 ENCOUNTER — Ambulatory Visit: Payer: Managed Care, Other (non HMO) | Admitting: Obstetrics and Gynecology

## 2019-01-16 ENCOUNTER — Encounter: Payer: Self-pay | Admitting: Obstetrics and Gynecology

## 2019-01-16 ENCOUNTER — Ambulatory Visit
Admission: RE | Admit: 2019-01-16 | Discharge: 2019-01-16 | Disposition: A | Discharge status: Home / Self Care | Payer: Managed Care, Other (non HMO) | Source: Ambulatory Visit | Attending: Obstetrics and Gynecology | Admitting: Obstetrics and Gynecology

## 2019-01-16 DIAGNOSIS — Z1231 Encounter for screening mammogram for malignant neoplasm of breast: Secondary | ICD-10-CM | POA: Insufficient documentation

## 2019-02-09 ENCOUNTER — Other Ambulatory Visit: Payer: Self-pay

## 2019-02-09 ENCOUNTER — Encounter: Payer: Self-pay | Admitting: Obstetrics and Gynecology

## 2019-02-09 ENCOUNTER — Ambulatory Visit (INDEPENDENT_AMBULATORY_CARE_PROVIDER_SITE_OTHER): Payer: Managed Care, Other (non HMO) | Admitting: Obstetrics and Gynecology

## 2019-02-09 VITALS — BP 100/60 | HR 76 | Ht 64.0 in | Wt 118.0 lb

## 2019-02-09 DIAGNOSIS — Z01419 Encounter for gynecological examination (general) (routine) without abnormal findings: Secondary | ICD-10-CM | POA: Diagnosis not present

## 2019-02-09 DIAGNOSIS — Z1231 Encounter for screening mammogram for malignant neoplasm of breast: Secondary | ICD-10-CM

## 2019-02-09 DIAGNOSIS — Z124 Encounter for screening for malignant neoplasm of cervix: Secondary | ICD-10-CM

## 2019-02-09 DIAGNOSIS — R3129 Other microscopic hematuria: Secondary | ICD-10-CM

## 2019-02-09 DIAGNOSIS — Z0000 Encounter for general adult medical examination without abnormal findings: Secondary | ICD-10-CM

## 2019-02-09 LAB — POCT URINALYSIS DIPSTICK
Bilirubin, UA: NEGATIVE
Blood, UA: NEGATIVE
Glucose, UA: NEGATIVE
Ketones, UA: NEGATIVE
Leukocytes, UA: NEGATIVE
Nitrite, UA: NEGATIVE
Protein, UA: NEGATIVE
Spec Grav, UA: 1.015 (ref 1.010–1.025)
pH, UA: 6 (ref 5.0–8.0)

## 2019-02-09 NOTE — Progress Notes (Signed)
PCP: Steele Sizer, MD   Chief Complaint  Patient presents with  . Gynecologic Exam    No complaints    HPI:      [name] is a 59 y.o. G1P1001 who LMP was No LMP recorded. Patient is postmenopausal., presents today for her annual examination.  Her menses are absent due to menopause. She does not have intermenstrual bleeding. She does not have vasomotor sx.  Sex activity: single partner, contraception - post menopausal status. She does have vaginal dryness. She is using a lubricant with success. Was too worried about vag ERT last yr to try it.  Last Pap: 12/27/17  Results were: no abnormalities /neg HPV DNA 2017. Pt likes yearly paps since free through work. Hx of STDs: none  Last mammogram: 01/16/19 Results were: normal--routine follow-up in 12 months. There is a FH of breast cancer in her mat aunt, genetic testing not indicated. There is no FH of ovarian cancer. The patient does not do self-breast exams.  Colonoscopy: colonoscopy 7 years ago without abnormalities. . Repeat due after 10 years--pt unsure.   Tobacco use: The patient denies current or previous tobacco use. Alcohol use: none  No drug use Exercise: moderately active  She does get adequate calcium and Vitamin D in her diet.  She was diagnosed with kidney stones in past. Had UTI recently and treated with abx. Had hematuria and needs repeat UA after abx tx. UTI sx resolved. No kidney stone sx.   She is taking Vit D supp and getting calcium in diet. She was Vit D deficient last yr. Normal labs at work, but gets CMP here yearly.    Past Medical History:  Diagnosis Date  . Calculus of kidney 2017  . Enlarged lymph node    right goin  . Frequency of urination   . Heart murmur   . History of rheumatic fever   . History of scarlet fever   . Kidney stones 2017  . 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 Nephrolithiasis    left  . Sensation of pressure in bladder area   . Urgency of urination     Past Surgical History:  Procedure Laterality Date  . BASAL CELL CARCINOMA EXCISION  09/2018   Nose  . BREAST BIOPSY Left 2014  . COLONOSCOPY  06/2012  . CYSTOSCOPY W/ RETROGRADES Bilateral 09/03/2015   Procedure: CYSTOSCOPY WITH RETROGRADE PYELOGRAM;  Surgeon: Barron Alvine, MD;  Location: [facility];  Service: Urology;  Laterality: Bilateral;  . CYSTOSCOPY WITH HOLMIUM LASER LITHOTRIPSY Right 09/03/2015   Procedure: CYSTOSCOPY WITH HOLMIUM LASER LITHOTRIPSY;  Surgeon: Barron Alvine, MD;  Location: [facility];  Service: Urology;  Laterality: Right;  . CYSTOSCOPY WITH RETROGRADE PYELOGRAM, URETEROSCOPY AND STENT PLACEMENT Right 09/03/2015   Procedure: CYSTOSCOPY WITH RETROGRADE PYELOGRAM, URETEROSCOPY AND STENT PLACEMENT;  Surgeon: Barron Alvine, MD;  Location: [facility];  Service: Urology;  Laterality: Right;  1 HOUR  . CYSTOSCOPY WITH RETROGRADE PYELOGRAM, URETEROSCOPY AND STENT PLACEMENT Left 12/17/2015   Procedure: CYSTOSCOPY WITH RETROGRADE PYELOGRAM, URETEROSCOPY AND STENT PLACEMENT;  Surgeon: Barron Alvine, MD;  Location: [facility];  Service: Urology;  Laterality: Left;  . EXCISION LEFT BREAST CYST  01/2003   benign  . HOLMIUM LASER APPLICATION Left 12/17/2015   Procedure: HOLMIUM LASER APPLICATION;  Surgeon:  Barron Alvine, MD;  Location: [facility];  Service: Urology;  Laterality: Left;  . STAPEDES SURGERY  x2 last one 2009   inner ear  . STONE EXTRACTION WITH BASKET Right 09/03/2015   Procedure: STONE EXTRACTION WITH BASKET;  Surgeon: Barron Alvine, MD;  Location: [facility];  Service: Urology;  Laterality: Right;    Family History  Problem Relation Age of Onset  . Hypertension Father   . Thyroid disease Mother   . Breast cancer Maternal Aunt 60  . Diabetes Maternal Aunt   . Leukemia Paternal  Uncle 71  . ALS Paternal Aunt 32  . ALS Cousin 11       son of affected aunt    Social History   Socioeconomic History  . Marital status: Married    Spouse name: Not on file  . Number of children: Not on file  . Years of education: Not on file  . Highest education level: Not on file  Occupational History  . Not on file  Tobacco Use  . Smoking status: Never Smoker  . Smokeless tobacco: Never Used  Substance and Sexual Activity  . Alcohol use: No  . Drug use: No  . Sexual activity: Yes    Birth control/protection: Post-menopausal  Other Topics Concern  . Not on file  Social History Narrative  . Not on file   Social Determinants of Health   Financial Resource Strain:   . Difficulty of Paying Living Expenses: Not on file  Food Insecurity:   . Worried About Programme researcher, broadcasting/film/video in the Last Year: Not on file  . Ran Out of Food in the Last Year: Not on file  Transportation Needs:   . Lack of Transportation (Medical): Not on file  . Lack of Transportation (Non-Medical): Not on file  Physical Activity:   . Days of Exercise per Week: Not on file  . Minutes of Exercise per Session: Not on file  Stress:   . Feeling of Stress : Not on file  Social Connections:   . Frequency of Communication with Friends and Family: Not on file  . Frequency of Social Gatherings with Friends and Family: Not on file  . Attends Religious Services: Not on file  . Active Member of Clubs or Organizations: Not on file  . Attends Banker Meetings: Not on file  . Marital Status: Not on file  Intimate Partner Violence:   . Fear of Current or Ex-Partner: Not on file  . Emotionally Abused: Not on file  . Physically Abused: Not on file  . Sexually Abused: Not on file    Current Meds  Medication Sig  . Cholecalciferol (VITAMIN D3) 2000 units TABS Take 1 tablet by mouth daily.  . Diphenhydramine-Pseudoephed (EQ ALLERGY/SINUS PO) Take 1 capsule by mouth every morning.  . fish oil-omega-3  fatty acids 1000 MG capsule Take 2 g by mouth daily.  . magnesium oxide (MAG-OX) 400 MG tablet Take by mouth.  . tretinoin (RETIN-A) 0.05 % cream Wash face and wait at least 20 minutes.  Apply a pea size amount to the face except upper eyelids. Skip a few nights if you get too dry      ROS:  Review of Systems  Constitutional: Negative for fatigue, fever and unexpected weight change.  Respiratory: Negative for cough, shortness of breath and wheezing.   Cardiovascular: Negative for chest pain, palpitations and leg swelling.  Gastrointestinal: Negative for blood in stool, constipation, diarrhea, nausea and vomiting.  Endocrine: Negative for cold intolerance, heat intolerance and polyuria.  Genitourinary: Negative for dyspareunia, dysuria, flank pain, frequency, genital sores, hematuria, menstrual problem, pelvic pain, urgency, vaginal bleeding, vaginal discharge and vaginal pain.  Musculoskeletal: Negative for back pain, joint swelling and myalgias.  Skin: Negative for rash.  Neurological: Negative for dizziness, syncope, light-headedness, numbness and headaches.  Hematological: Negative for adenopathy.  Psychiatric/Behavioral: Negative for agitation, confusion, sleep disturbance and suicidal ideas. The patient is not nervous/anxious.      Objective: BP 100/60 (BP Location: Left Arm, Patient Position: Sitting, Cuff Size: Normal)   Pulse 76   Ht 5\' 4"  (1.626 m)   Wt 118 lb (53.5 kg)   BMI 20.25 kg/m    Physical Exam Constitutional:      Appearance: She is well-developed.  Genitourinary:     Vulva, vagina, uterus, right adnexa and left adnexa normal.     No vulval lesion or tenderness noted.     No vaginal discharge, erythema or tenderness.     No cervical motion tenderness or polyp.     Uterus is not enlarged or tender.     No right or left adnexal mass present.     Right adnexa not tender.     Left adnexa not tender.     Genitourinary Comments: MOD VAG ATROPHY; SMALL  INTROITUS   Neck:     Thyroid: No thyromegaly.  Cardiovascular:     Rate and Rhythm: Normal rate and regular rhythm.     Heart sounds: Normal heart sounds. No murmur.  Pulmonary:     Effort: Pulmonary effort is normal.     Breath sounds: Normal breath sounds.  Chest:     Breasts:        Right: No mass, nipple discharge, skin change or tenderness.        Left: No mass, nipple discharge, skin change or tenderness.  Abdominal:     Palpations: Abdomen is soft.     Tenderness: There is no abdominal tenderness. There is no guarding.  Musculoskeletal:        General: Normal range of motion.     Cervical back: Normal range of motion.  Neurological:     General: No focal deficit present.     Mental Status: She is alert and oriented to person, place, and time.     Cranial Nerves: No cranial nerve deficit.  Skin:    General: Skin is warm and dry.  Psychiatric:        Mood and Affect: Mood normal.        Behavior: Behavior normal.        Thought Content: Thought content normal.        Judgment: Judgment normal.  Vitals reviewed.   RESULTS:  Results for orders placed or performed in visit on 02/09/19 (from the past 24 hour(s))  POCT Urinalysis Dipstick     Status: Normal   Collection Time: 02/09/19  5:00 PM  Result Value Ref Range   Color, UA yellow    Clarity, UA clear    Glucose, UA Negative Negative   Bilirubin, UA neg    Ketones, UA neg    Spec Grav, UA 1.015 1.010 - 1.025   Blood, UA neg    pH, UA 6.0 5.0 - 8.0   Protein, UA Negative Negative   Urobilinogen, UA     Nitrite, UA neg    Leukocytes, UA Negative Negative   Appearance     Odor  Assessment/Plan:  Encounter for annual routine gynecological examination  Cervical cancer screening - Plan: Pap UNDER 30  Encounter for screening mammogram for malignant neoplasm of breast; pt current on mammo  Blood tests for routine general physical examination - Plan: Comprehensive metabolic panel  Other microscopic  hematuria--neg UA. Reassurance. F/u prn.           GYN counsel mammography screening, menopause, adequate intake of calcium and vitamin D, diet and exercise    F/U  Return in about 1 year (around 02/09/2020).  Bessie Livingood B. Mahki Spikes, PA-C 02/09/2019 5:00 PM

## 2019-02-09 NOTE — Patient Instructions (Signed)
I value your feedback and entrusting us with your care. If you get a [place] patient survey, I would appreciate you taking the time to let us know about your experience today. Thank you!  As of January 11, 2019, your lab results will be released to your MyChart immediately, before I even have a chance to see them. Please give me time to review them and contact you if there are any abnormalities. Thank you for your patience.  

## 2019-02-10 LAB — COMPREHENSIVE METABOLIC PANEL
ALT: 14 IU/L (ref 0–32)
AST: 18 IU/L (ref 0–40)
Albumin/Globulin Ratio: 2.4 — ABNORMAL HIGH (ref 1.2–2.2)
Albumin: 5 g/dL — ABNORMAL HIGH (ref 3.8–4.9)
Alkaline Phosphatase: 81 IU/L (ref 39–117)
BUN/Creatinine Ratio: 24 — ABNORMAL HIGH (ref 9–23)
BUN: 16 mg/dL (ref 6–24)
Bilirubin Total: 0.4 mg/dL (ref 0.0–1.2)
CO2: 26 mmol/L (ref 20–29)
Calcium: 9.6 mg/dL (ref 8.7–10.2)
Chloride: 101 mmol/L (ref 96–106)
Creatinine, Ser: 0.67 mg/dL (ref 0.57–1.00)
GFR calc Af Amer: 112 mL/min/{1.73_m2} (ref >59)
GFR calc non Af Amer: 97 mL/min/{1.73_m2} (ref >59)
Globulin, Total: 2.1 g/dL (ref 1.5–4.5)
Glucose: 76 mg/dL (ref 65–99)
Potassium: 4.1 mmol/L (ref 3.5–5.2)
Sodium: 141 mmol/L (ref 134–144)
Total Protein: 7.1 g/dL (ref 6.0–8.5)

## 2019-02-14 LAB — IGP, RFX APTIMA HPV ASCU

## 2021-06-22 IMAGING — MG DIGITAL SCREENING BILAT W/ TOMO W/ CAD
8 series · 9 of 24 positions shown · non-contrast
Comparison: Previous exam(s).

CLINICAL DATA: Screening.

EXAM:
DIGITAL SCREENING BILATERAL MAMMOGRAM WITH TOMO AND CAD

[R CC synth-2D]
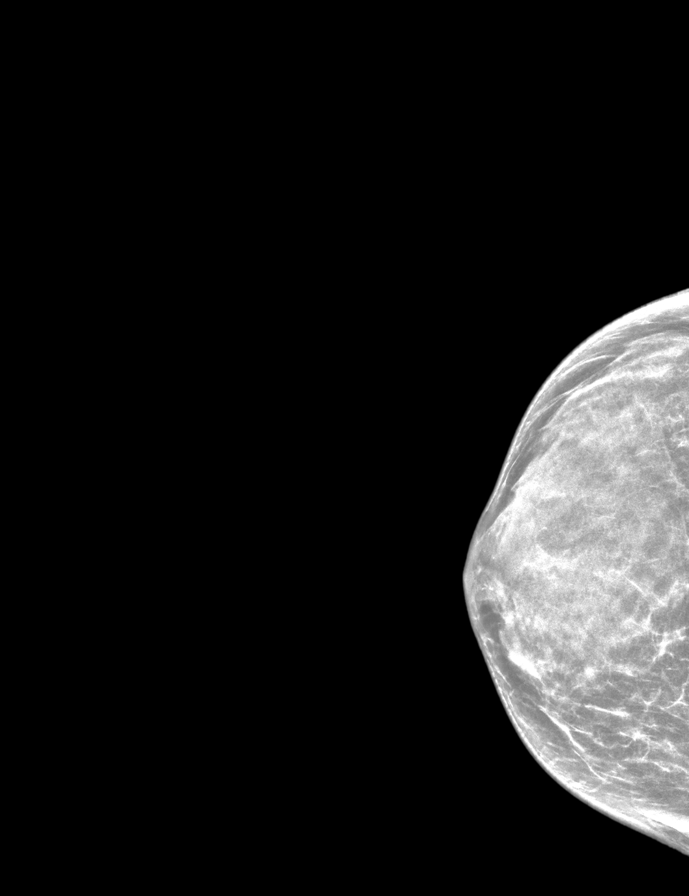

[L MLO synth-2D]
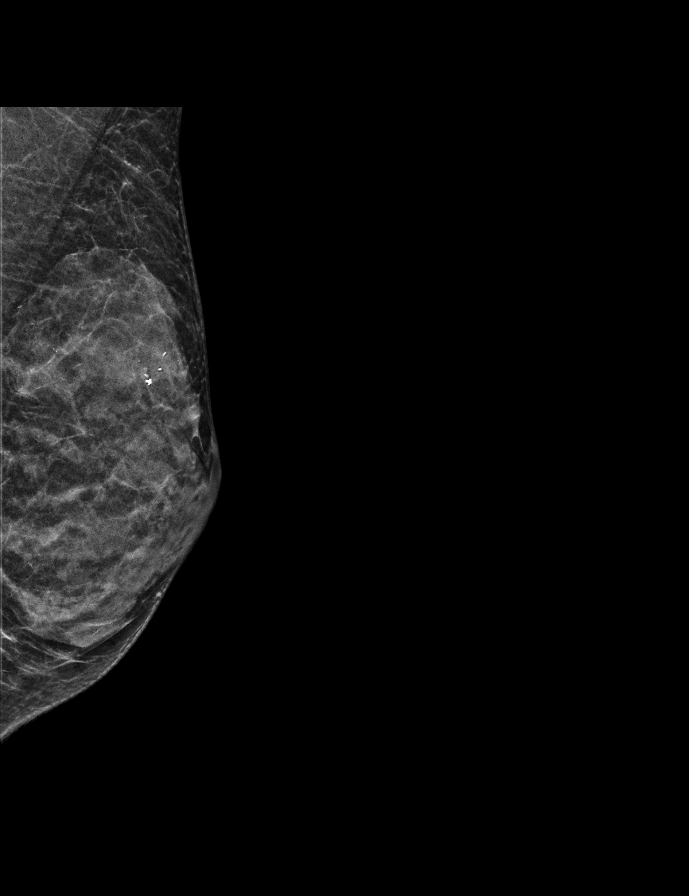

[R MLO synth-2D]
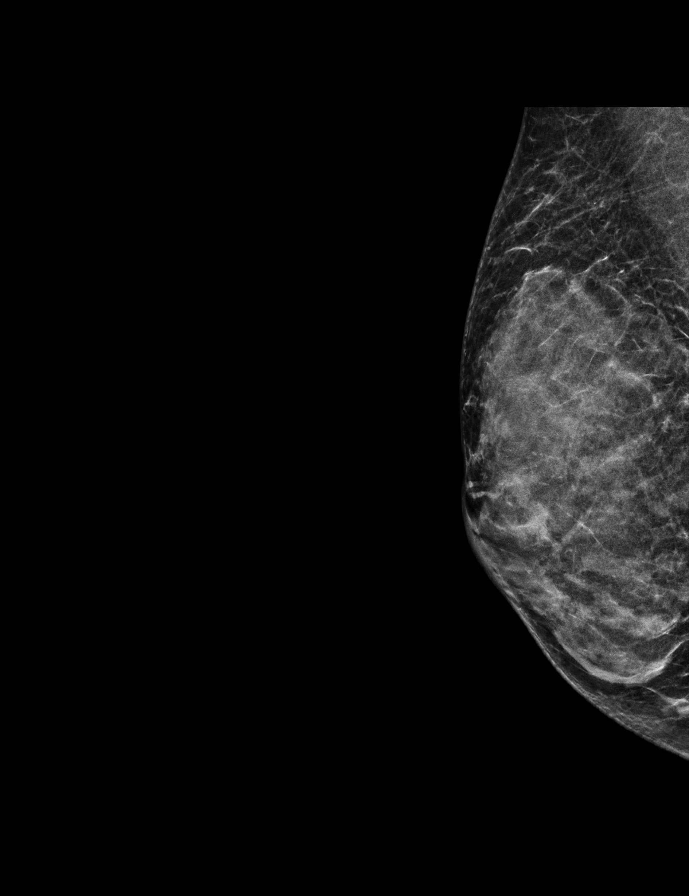

[L CC synth-2D]
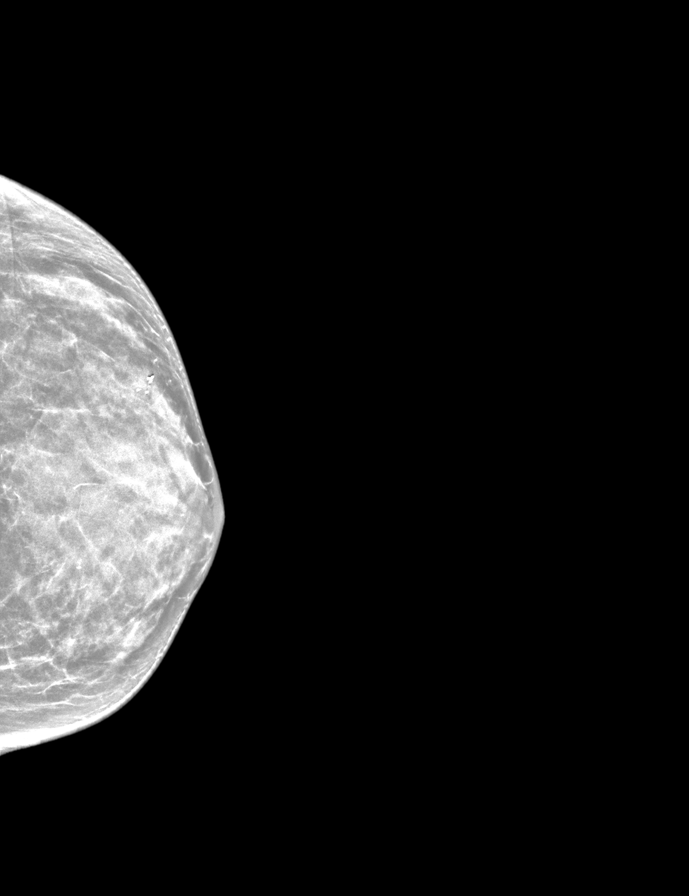

[R MLO tomo · 2 of 37 frames shown]
[frame 13/37]
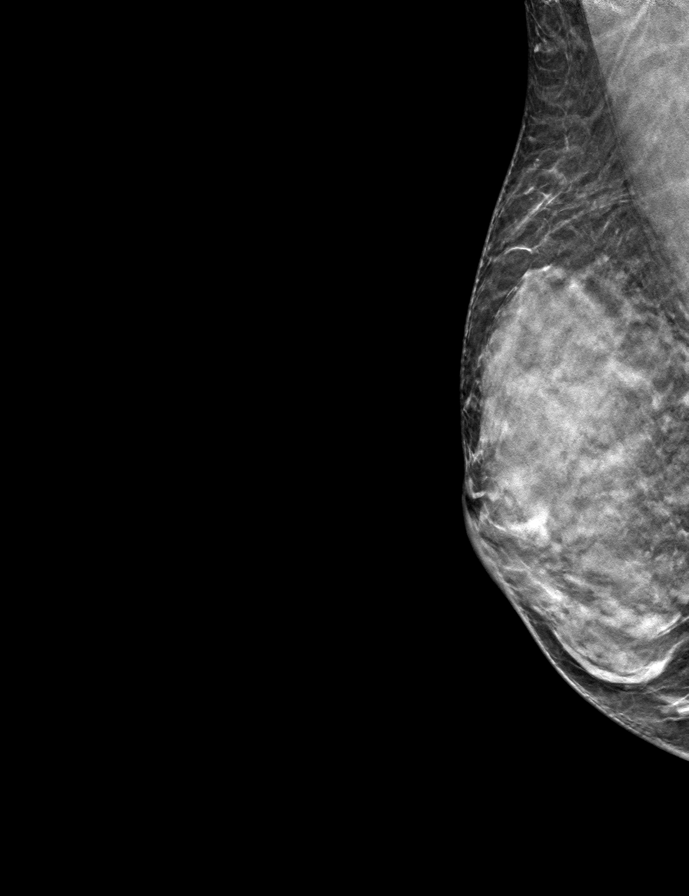
[frame 19/37]
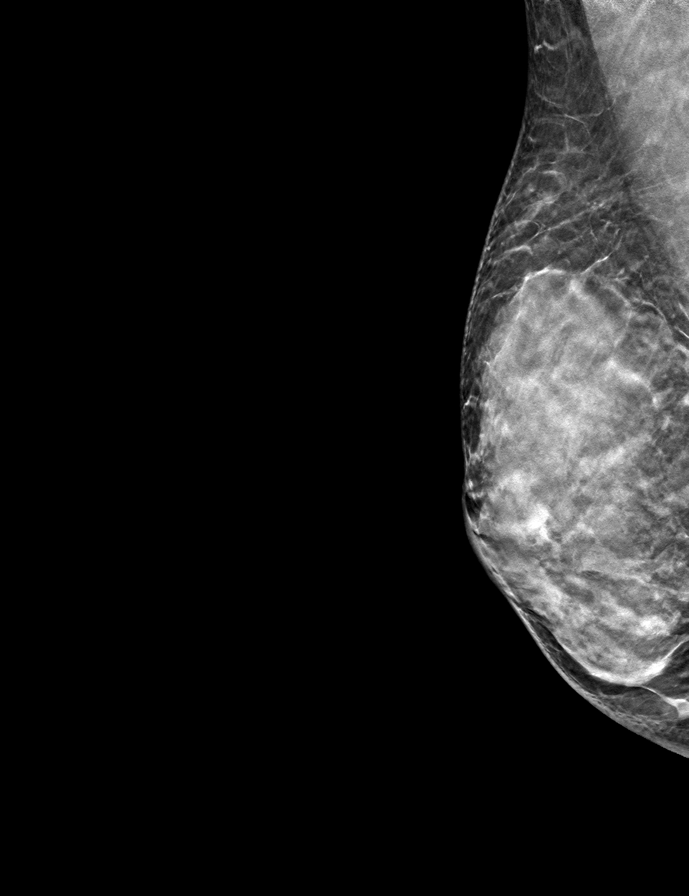

[L MLO tomo · tomo slice 19/36.0]
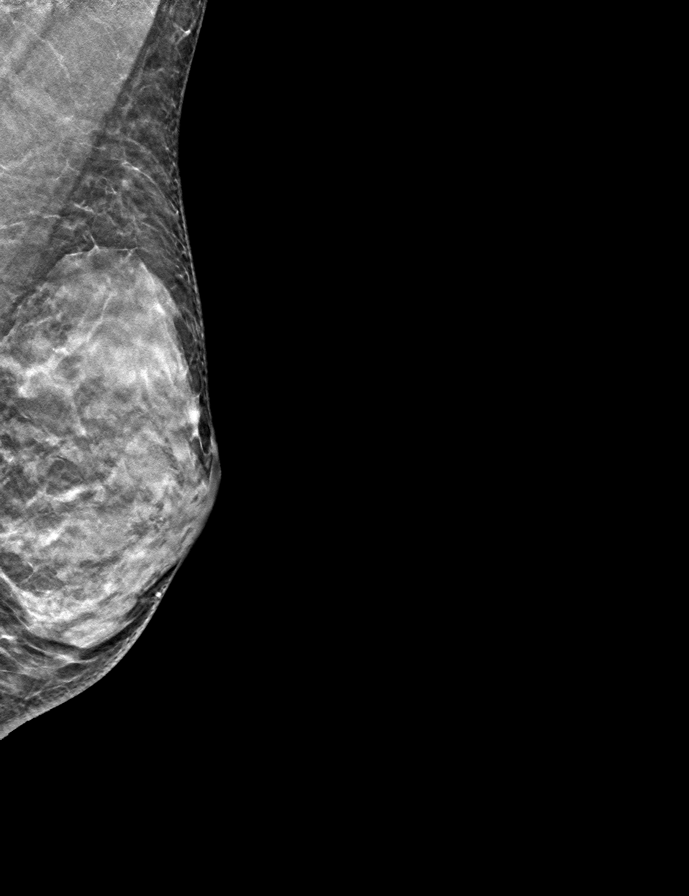

[R CC tomo · tomo slice 23/45.0]
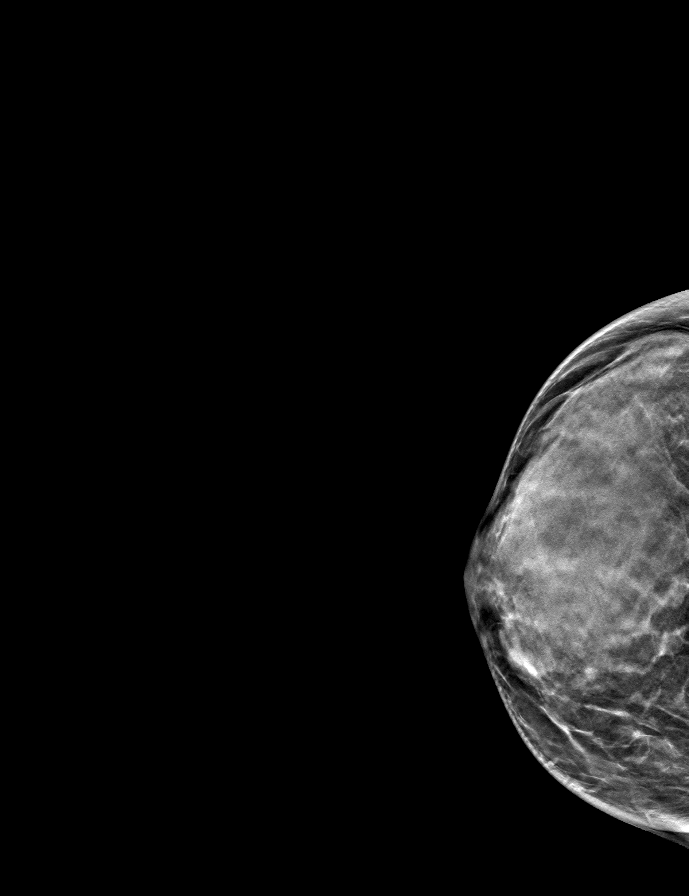

[L CC tomo · tomo slice 21/42.0]
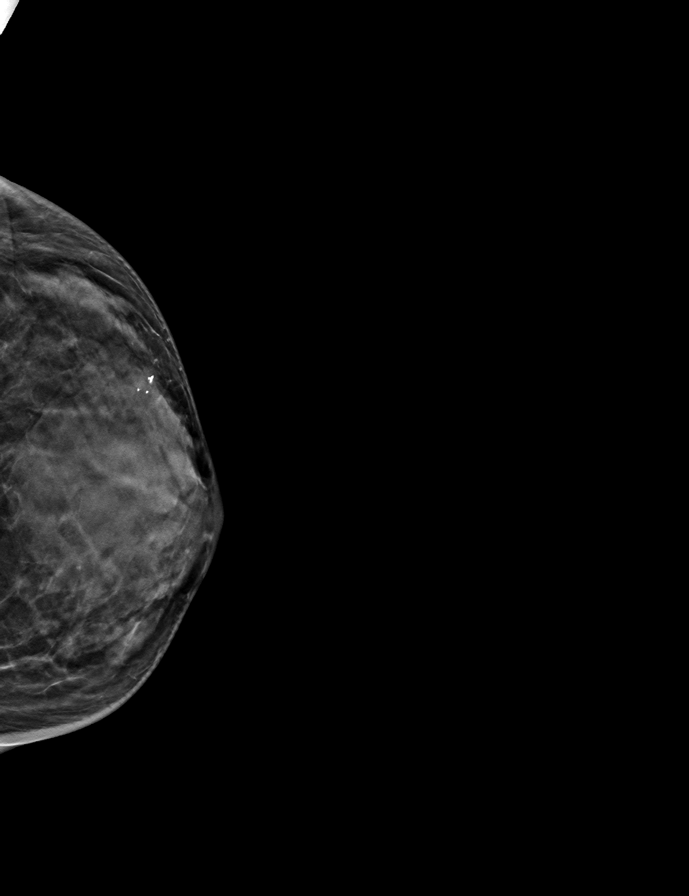

[9 of 24 positions shown; findings below may reference images not displayed]

ACR Breast Density Category d: The breast tissue is extremely dense,
which lowers the sensitivity of mammography
FINDINGS: There are no findings suspicious for malignancy. Images were
processed with CAD.
IMPRESSION: No mammographic evidence of malignancy. A result letter of this
screening mammogram will be mailed directly to the patient.

RECOMMENDATION:
Screening mammogram in one year. (Code:WO-0-ZI0)

BI-RADS CATEGORY  1: Negative.
